# Patient Record
Sex: Female | Born: 1990 | Race: Black or African American | Hispanic: No | Marital: Single | Smoking: Never smoker
Health system: Southern US, Community
[De-identification: ages and names within clinical notes are randomized; demographics above are authoritative.]

## PROBLEM LIST (undated history)

## (undated) ENCOUNTER — Emergency Department (HOSPITAL_COMMUNITY): Payer: 59

## (undated) DIAGNOSIS — R51 Headache: Secondary | ICD-10-CM

## (undated) DIAGNOSIS — R519 Headache, unspecified: Secondary | ICD-10-CM

## (undated) HISTORY — DX: Headache, unspecified: R51.9

## (undated) HISTORY — DX: Headache: R51

---
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
## 2011-03-27 LAB — POCT URINALYSIS DIP (DEVICE)
Bilirubin Urine: NEGATIVE
Glucose, UA: NEGATIVE mg/dL
Ketones, ur: NEGATIVE mg/dL
Nitrite: NEGATIVE
Specific Gravity, Urine: 1.02 (ref 1.005–1.030)
pH: 7.5 (ref 5.0–8.0)

## 2011-03-28 LAB — URINALYSIS, ROUTINE W REFLEX MICROSCOPIC
Bilirubin Urine: NEGATIVE
Glucose, UA: NEGATIVE
Hgb urine dipstick: NEGATIVE
Protein, ur: NEGATIVE

## 2011-03-28 LAB — CBC
HCT: 34.2 — ABNORMAL LOW
Hemoglobin: 11.6 — ABNORMAL LOW
MCHC: 33.8
RBC: 3.65 — ABNORMAL LOW
RDW: 12.8

## 2011-03-28 LAB — GC/CHLAMYDIA PROBE AMP, GENITAL
Chlamydia, DNA Probe: POSITIVE — AB
GC Probe Amp, Genital: NEGATIVE

## 2011-03-28 LAB — WET PREP, GENITAL
Trich, Wet Prep: NONE SEEN
Yeast Wet Prep HPF POC: NONE SEEN

## 2012-03-29 ENCOUNTER — Emergency Department (HOSPITAL_COMMUNITY)
Admission: EM | Admit: 2012-03-29 | Discharge: 2012-03-29 | Disposition: A | Discharge status: Home / Self Care | Payer: 59 | Attending: Emergency Medicine | Admitting: Emergency Medicine

## 2012-03-29 ENCOUNTER — Encounter (HOSPITAL_COMMUNITY): Payer: Self-pay | Admitting: Emergency Medicine

## 2012-03-29 DIAGNOSIS — M791 Myalgia, unspecified site: Secondary | ICD-10-CM

## 2012-03-29 DIAGNOSIS — IMO0001 Reserved for inherently not codable concepts without codable children: Secondary | ICD-10-CM | POA: Insufficient documentation

## 2012-03-29 DIAGNOSIS — M2550 Pain in unspecified joint: Secondary | ICD-10-CM | POA: Insufficient documentation

## 2012-03-29 DIAGNOSIS — N390 Urinary tract infection, site not specified: Secondary | ICD-10-CM | POA: Insufficient documentation

## 2012-03-29 LAB — URINALYSIS, ROUTINE W REFLEX MICROSCOPIC
Bilirubin Urine: NEGATIVE
Glucose, UA: NEGATIVE mg/dL
Ketones, ur: NEGATIVE mg/dL
Nitrite: POSITIVE — AB
Protein, ur: NEGATIVE mg/dL
Specific Gravity, Urine: 1.024 (ref 1.005–1.030)
Urobilinogen, UA: 1 mg/dL (ref 0.0–1.0)
pH: 6.5 (ref 5.0–8.0)

## 2012-03-29 LAB — URINE MICROSCOPIC-ADD ON

## 2012-03-29 MED ORDER — HYDROCODONE-ACETAMINOPHEN 5-500 MG PO TABS: 1.0000 | ORAL_TABLET | Freq: Four times a day (QID) | ORAL | Status: DC | PRN

## 2012-03-29 MED ORDER — ONDANSETRON HCL 4 MG PO TABS: 4.0000 mg | ORAL_TABLET | Freq: Four times a day (QID) | ORAL | Status: DC

## 2012-03-29 MED ORDER — CIPROFLOXACIN HCL 500 MG PO TABS: 500.0000 mg | ORAL_TABLET | Freq: Two times a day (BID) | ORAL | Status: DC

## 2012-03-29 NOTE — ED Notes (Signed)
Pt presents w/ body aches, low to mid back pain, headache since Monday. Denies sorethroat or ear pain. Denies UTI sx. Sent from [facility] and Flu swabs A & B were both negative. Rx'ed w/ numerous OTC counters w/o any relief.

## 2012-03-29 NOTE — ED Notes (Signed)
Received report from [facility], [employer] fundraiser. Assumed care at this time.

## 2012-03-29 NOTE — ED Provider Notes (Signed)
History     CSN: [id number]  Arrival date & time 03/29/12  1324   First MD Initiated Contact with Patient 03/29/12 1518      Chief Complaint  Patient presents with  . Influenza    (Consider location/radiation/quality/duration/timing/severity/associated sxs/prior treatment) HPI  Pt complaints of polyarthralgias and myalgias for one week without systemic symptoms. She did have a headache on Monday but has no headache today. She denies having one area hurt worse than others. She denies induration to the joints. She denies weight loss. She has been eating normal and has not felt weak. Other than her joint pains she has no other complaints. No N/V/D  History reviewed. No pertinent past medical history.  History reviewed. No pertinent past surgical history.  No family history on file.  History  Substance Use Topics  . Smoking status: Never Smoker   . Smokeless tobacco: Never Used  . Alcohol Use: Yes     very rarely - liquor    OB History    Grav Para Term Preterm Abortions TAB SAB Ect Mult Living                  Review of Systems  Review of Systems  Gen: no weight loss, fevers, chills, night sweats  Eyes: no discharge or drainage, no occular pain or visual changes  Nose: no epistaxis or rhinorrhea  Mouth: no dental pain, no sore throat  Neck: no neck pain  Lungs:No wheezing, coughing or hemoptysis CV: no chest pain, palpitations, dependent edema or orthopnea  Abd: no abdominal pain, nausea, vomiting  GU: no dysuria or gross hematuria  MSK:  polyarthralgia and myalgias.  Neuro: no headache, no focal neurologic deficits  Skin: no abnormalities Psyche: negative.   Allergies  Review of patient's allergies indicates no known allergies.  Home Medications   Current Outpatient Rx  Name Route Sig Dispense Refill  . BC HEADACHE PO Oral Take 1 packet by mouth daily as needed. Pain.    . IBUPROFEN 200 MG PO TABS Oral Take 200 mg by mouth every 6 (six) hours as needed.  Pain.    Marland Kitchen CIPROFLOXACIN HCL 500 MG PO TABS Oral Take 1 tablet (500 mg total) by mouth 2 (two) times daily. 14 tablet 0  . HYDROCODONE-ACETAMINOPHEN 5-500 MG PO TABS Oral Take 1-2 tablets by mouth every 6 (six) hours as needed for pain. 15 tablet 0  . ONDANSETRON HCL 4 MG PO TABS Oral Take 1 tablet (4 mg total) by mouth every 6 (six) hours. 12 tablet 0    BP 99/55  Pulse 89  Temp 99 F (37.2 C) (Oral)  Resp 16  Ht 5\' 8"  (1.727 m)  Wt 140 lb (63.504 kg)  BMI 21.29 kg/m2  SpO2 91%  Physical Exam  Nursing note and vitals reviewed. Constitutional: She appears well-developed and well-nourished. No distress.  HENT:  Head: Normocephalic and atraumatic.  Eyes: Pupils are equal, round, and reactive to light.  Neck: Normal range of motion. Neck supple.  Cardiovascular: Normal rate and regular rhythm.   Pulmonary/Chest: Effort normal.  Abdominal: Soft.  Musculoskeletal: Normal range of motion.  Neurological: She is alert.  Skin: Skin is warm and dry.    ED Course  Procedures (including critical care time)  Labs Reviewed  URINALYSIS, ROUTINE W REFLEX MICROSCOPIC - Abnormal; Notable for the following:    APPearance CLOUDY (*)     Hgb urine dipstick TRACE (*)     Nitrite POSITIVE (*)  Leukocytes, UA TRACE (*)     All other components within normal limits  URINE MICROSCOPIC-ADD ON - Abnormal; Notable for the following:    Bacteria, UA MANY (*)     All other components within normal limits  PREGNANCY, URINE   No results found.   1. Myalgia   2. Polyarthralgia   3. UTI (lower urinary tract infection)       MDM  No warning symptoms of joints paints. Symptoms have been for 1 week. I have advised the patient that if the symptoms persist past a total of two weeks than she needs to be re-evaluated for further work-up into why she is having joints pains. Such as immune diseases or other bad causes of pains. She voices her understanding. In the meantime, she needs to get rest,  drink plenty of water and eat well. Pt also has UTI, no concerns for Pyelo. Cipro and Zofran written.   Pt has been advised of the symptoms that warrant their return to the ED. Patient has voiced understanding and has agreed to follow-up with the PCP or specialist.         Dorthula Matas, PA 03/29/12 1656

## 2012-04-01 NOTE — ED Provider Notes (Signed)
Medical screening examination/treatment/procedure(s) were performed by non-physician practitioner and as supervising physician I was immediately available for consultation/collaboration.  Raeford Razor, MD 04/01/12 [phone]

## 2014-02-11 ENCOUNTER — Emergency Department (HOSPITAL_COMMUNITY): Payer: 59

## 2014-02-11 ENCOUNTER — Emergency Department (HOSPITAL_COMMUNITY)
Admission: EM | Admit: 2014-02-11 | Discharge: 2014-02-11 | Disposition: A | Discharge status: Home / Self Care | Payer: 59 | Attending: Emergency Medicine | Admitting: Emergency Medicine

## 2014-02-11 ENCOUNTER — Encounter (HOSPITAL_COMMUNITY): Payer: Self-pay | Admitting: Emergency Medicine

## 2014-02-11 DIAGNOSIS — S50312A Abrasion of left elbow, initial encounter: Secondary | ICD-10-CM

## 2014-02-11 DIAGNOSIS — Z79899 Other long term (current) drug therapy: Secondary | ICD-10-CM | POA: Diagnosis not present

## 2014-02-11 DIAGNOSIS — S7010XA Contusion of unspecified thigh, initial encounter: Secondary | ICD-10-CM | POA: Insufficient documentation

## 2014-02-11 DIAGNOSIS — S59909A Unspecified injury of unspecified elbow, initial encounter: Secondary | ICD-10-CM | POA: Insufficient documentation

## 2014-02-11 DIAGNOSIS — Y9389 Activity, other specified: Secondary | ICD-10-CM | POA: Diagnosis not present

## 2014-02-11 DIAGNOSIS — Z7982 Long term (current) use of aspirin: Secondary | ICD-10-CM | POA: Insufficient documentation

## 2014-02-11 DIAGNOSIS — S59919A Unspecified injury of unspecified forearm, initial encounter: Secondary | ICD-10-CM

## 2014-02-11 DIAGNOSIS — S6990XA Unspecified injury of unspecified wrist, hand and finger(s), initial encounter: Secondary | ICD-10-CM

## 2014-02-11 DIAGNOSIS — S7012XA Contusion of left thigh, initial encounter: Secondary | ICD-10-CM

## 2014-02-11 DIAGNOSIS — Y92410 Unspecified street and highway as the place of occurrence of the external cause: Secondary | ICD-10-CM | POA: Insufficient documentation

## 2014-02-11 DIAGNOSIS — IMO0002 Reserved for concepts with insufficient information to code with codable children: Secondary | ICD-10-CM | POA: Diagnosis not present

## 2014-02-11 DIAGNOSIS — M25522 Pain in left elbow: Secondary | ICD-10-CM

## 2014-02-11 DIAGNOSIS — Z3202 Encounter for pregnancy test, result negative: Secondary | ICD-10-CM | POA: Diagnosis not present

## 2014-02-11 LAB — POC URINE PREG, ED: Preg Test, Ur: NEGATIVE

## 2014-02-11 MED ORDER — HYDROCODONE-ACETAMINOPHEN 5-325 MG PO TABS: 1.0000 | ORAL_TABLET | Freq: Four times a day (QID) | ORAL | Status: DC | PRN

## 2014-02-11 MED ORDER — OXYCODONE HCL 5 MG PO TABS
5.0000 mg | ORAL_TABLET | Freq: Once | ORAL | Status: AC
  Administered 2014-02-11: 5 mg via ORAL
  Filled 2014-02-11: qty 1

## 2014-02-11 MED ORDER — METHOCARBAMOL 500 MG PO TABS: 500.0000 mg | ORAL_TABLET | Freq: Two times a day (BID) | ORAL | Status: DC

## 2014-02-11 MED ORDER — ACETAMINOPHEN 325 MG PO TABS
650.0000 mg | ORAL_TABLET | Freq: Once | ORAL | Status: AC
  Administered 2014-02-11: 650 mg via ORAL
  Filled 2014-02-11: qty 2

## 2014-02-11 MED ORDER — NAPROXEN 500 MG PO TABS: 500.0000 mg | ORAL_TABLET | Freq: Two times a day (BID) | ORAL | Status: DC

## 2014-02-11 NOTE — ED Notes (Signed)
Pt ambulatory to restroom
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
## 2014-02-11 NOTE — ED Notes (Signed)
PA at bedside.
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
## 2014-08-20 ENCOUNTER — Ambulatory Visit (INDEPENDENT_AMBULATORY_CARE_PROVIDER_SITE_OTHER): Payer: 59 | Admitting: Licensed Clinical Social Worker

## 2014-08-20 DIAGNOSIS — F3341 Major depressive disorder, recurrent, in partial remission: Secondary | ICD-10-CM

## 2015-08-17 DIAGNOSIS — Z01419 Encounter for gynecological examination (general) (routine) without abnormal findings: Secondary | ICD-10-CM | POA: Diagnosis not present

## 2015-08-17 DIAGNOSIS — N63 Unspecified lump in breast: Secondary | ICD-10-CM | POA: Diagnosis not present

## 2015-08-17 DIAGNOSIS — Z30431 Encounter for routine checking of intrauterine contraceptive device: Secondary | ICD-10-CM | POA: Diagnosis not present

## 2015-08-22 ENCOUNTER — Other Ambulatory Visit: Payer: Self-pay | Admitting: Nurse Practitioner

## 2015-08-22 DIAGNOSIS — N6320 Unspecified lump in the left breast, unspecified quadrant: Principal | ICD-10-CM

## 2015-08-22 DIAGNOSIS — N6325 Unspecified lump in the left breast, overlapping quadrants: Secondary | ICD-10-CM

## 2015-08-25 ENCOUNTER — Ambulatory Visit
Admission: RE | Admit: 2015-08-25 | Discharge: 2015-08-25 | Disposition: A | Discharge status: Home / Self Care | Payer: 59 | Source: Ambulatory Visit | Attending: Nurse Practitioner | Admitting: Nurse Practitioner

## 2015-08-25 DIAGNOSIS — N63 Unspecified lump in breast: Secondary | ICD-10-CM | POA: Diagnosis not present

## 2015-08-25 DIAGNOSIS — N6320 Unspecified lump in the left breast, unspecified quadrant: Principal | ICD-10-CM

## 2015-08-25 DIAGNOSIS — N6325 Unspecified lump in the left breast, overlapping quadrants: Secondary | ICD-10-CM

## 2015-09-20 DIAGNOSIS — Z3200 Encounter for pregnancy test, result unknown: Secondary | ICD-10-CM | POA: Diagnosis not present

## 2015-09-20 DIAGNOSIS — Z30433 Encounter for removal and reinsertion of intrauterine contraceptive device: Secondary | ICD-10-CM | POA: Diagnosis not present

## 2015-10-19 DIAGNOSIS — Z30431 Encounter for routine checking of intrauterine contraceptive device: Secondary | ICD-10-CM | POA: Diagnosis not present

## 2015-11-30 ENCOUNTER — Encounter: Payer: Self-pay | Admitting: Internal Medicine

## 2015-11-30 ENCOUNTER — Ambulatory Visit (INDEPENDENT_AMBULATORY_CARE_PROVIDER_SITE_OTHER): Payer: 59 | Admitting: Internal Medicine

## 2015-11-30 VITALS — BP 116/78 | HR 69 | Temp 99.0°F | Ht 69.0 in | Wt 180.0 lb

## 2015-11-30 DIAGNOSIS — R519 Headache, unspecified: Secondary | ICD-10-CM

## 2015-11-30 DIAGNOSIS — Z0000 Encounter for general adult medical examination without abnormal findings: Secondary | ICD-10-CM

## 2015-11-30 DIAGNOSIS — R21 Rash and other nonspecific skin eruption: Secondary | ICD-10-CM | POA: Diagnosis not present

## 2015-11-30 DIAGNOSIS — R51 Headache: Secondary | ICD-10-CM | POA: Diagnosis not present

## 2015-11-30 NOTE — Progress Notes (Signed)
HPI  Pt presents to the clinic today to establish care. She has not had a PCP in many years. She would like her annual exam today.  Frequent headaches: This was occuring every day over the last 2 months, but has been better over the last 2 weeks. It occurs in her temples. She describes the pain as sharp, stabbing and pens and needles. She denies dizziness or blurred visoin. She denies sensitivity to light and sound. She denies nausea and vomiting. She was taking Aleve with good relief.  She also reports recurrent ringworm on her abdomen. She does not have the rash now but reports it comes and goes. She uses Lotrimin with good relief but reports she is not sure why it keeps coming back.  Flu: never Tetanus: 2010 Pap Smear: 09/2015 Dentist: biannually  Diet: She does eat meat. She eats fruits and veggies a few days per week. She does consume some fried foods. She drinks soda, water and juice Exercise: None  Past Medical History  Diagnosis Date  . Frequent headaches     Current Outpatient Prescriptions  Medication Sig Dispense Refill  . levonorgestrel (MIRENA, 52 MG,) 20 MCG/24HR IUD by Intrauterine route once. Inserted 09/2015     No current facility-administered medications for this visit.    No Known Allergies  Family History  Problem Relation Age of Onset  . Depression Mother   . Hyperlipidemia Father   . Hypertension Father   . Depression Father   . Diabetes Father   . Diabetes Paternal Aunt   . Hyperlipidemia Paternal Aunt   . Hypertension Paternal Aunt   . Diabetes Paternal Uncle   . Hyperlipidemia Paternal Uncle   . Hypertension Paternal Uncle     Social History   Social History  . Marital Status: Single    Spouse Name: N/A  . Number of Children: N/A  . Years of Education: N/A   Occupational History  . Not on file.   Social History Main Topics  . Smoking status: Never Smoker   . Smokeless tobacco: Never Used  . Alcohol Use: Yes     Comment: very rarely -  liquor  . Drug Use: Yes    Special: Marijuana     Comment: every other week  . Sexual Activity: No   Other Topics Concern  . Not on file   Social History Narrative    ROS:  Constitutional: Denies fever, malaise, fatigue, headache or abrupt weight changes.  HEENT: Denies eye pain, eye redness, ear pain, ringing in the ears, wax buildup, runny nose, nasal congestion, bloody nose, or sore throat. Respiratory: Denies difficulty breathing, shortness of breath, cough or sputum production.   Cardiovascular: Denies chest pain, chest tightness, palpitations or swelling in the hands or feet.  Gastrointestinal: Denies abdominal pain, bloating, constipation, diarrhea or blood in the stool.  GU: Denies frequency, urgency, pain with urination, blood in urine, odor or discharge. Musculoskeletal: Denies decrease in range of motion, difficulty with gait, muscle pain or joint pain and swelling.  Skin: Denies redness, rashes, lesions or ulcercations.  Neurological: Denies dizziness, difficulty with memory, difficulty with speech or problems with balance and coordination.  Psych: Denies anxiety, depression, SI/HI.  No other specific complaints in a complete review of systems (except as listed in HPI above).  PE:  BP 116/78 mmHg  Pulse 69  Temp(Src) 99 F (37.2 C) (Oral)  Ht 5\' 9"  (1.753 m)  Wt 180 lb (81.647 kg)  BMI 26.57 kg/m2  SpO2 99% Wt  Readings from Last 3 Encounters:  11/30/15 180 lb (81.647 kg)  03/29/12 140 lb (63.504 kg)    General: Appears her stated age, well developed, well nourished in NAD. HEENT: Head: normal shape and size; Eyes: sclera white, no icterus, conjunctiva pink, PERRLA and EOMs intact; Ears: Tm's gray and intact, normal light reflex; Throat/Mouth: Teeth present, mucosa pink and moist, no lesions or ulcerations noted.  Neck: Neck supple, trachea midline. No masses, lumps or thyromegaly present.  Cardiovascular: Normal rate and rhythm. S1,S2 noted.  No murmur, rubs  or gallops noted.  Pulmonary/Chest: Normal effort and positive vesicular breath sounds. No respiratory distress. No wheezes, rales or ronchi noted.  Abdomen: Soft and nontender. Normal bowel sounds. No distention or masses noted. Liver, spleen and kidneys non palpable. Musculoskeletal: Strength 5/5 BUE/BLE. No signs of joint swelling. No difficulty with gait.  Neurological: Alert and oriented. Cranial nerves II-XII grossly intact. Coordination normal.  Psychiatric: Mood and affect normal. Behavior is normal. Judgment and thought content normal.    Assessment and Plan:  Preventative Health Maintenance:  Encouraged her to get a flu shot in the fall Tetanus UTD Pap Smear UTD Encouraged her to consume a balanced diet and start an exercise regimen Encouraged her to continue to see a dentist at least annually She had HIV screening 09/2015 Will check CBC, CMET, Lipid and A1C today  Frequent Headaches:  Improved Will continue to monitor   Rash:  Advised her next time it occurs, make appt to be seen to make sure it is ringworm  RTC in 1 year, sooner if needed

## 2015-11-30 NOTE — Patient Instructions (Signed)
Health Maintenance, Female Adopting a healthy lifestyle and getting preventive care can go a long way to promote health and wellness. Talk with your health care provider about what schedule of regular examinations is right for you. This is a good chance for you to check in with your provider about disease prevention and staying healthy. In between checkups, there are plenty of things you can do on your own. Experts have done a lot of research about which lifestyle changes and preventive measures are most likely to keep you healthy. Ask your health care provider for more information. WEIGHT AND DIET  Eat a healthy diet  Be sure to include plenty of vegetables, fruits, low-fat dairy products, and lean protein.  Do not eat a lot of foods high in solid fats, added sugars, or salt.  Get regular exercise. This is one of the most important things you can do for your health.  Most adults should exercise for at least 150 minutes each week. The exercise should increase your heart rate and make you sweat (moderate-intensity exercise).  Most adults should also do strengthening exercises at least twice a week. This is in addition to the moderate-intensity exercise.  Maintain a healthy weight  Body mass index (BMI) is a measurement that can be used to identify possible weight problems. It estimates body fat based on height and weight. Your health care provider can help determine your BMI and help you achieve or maintain a healthy weight.  For females 20 years of age and older:   A BMI below 18.5 is considered underweight.  A BMI of 18.5 to 24.9 is normal.  A BMI of 25 to 29.9 is considered overweight.  A BMI of 30 and above is considered obese.  Watch levels of cholesterol and blood lipids  You should start having your blood tested for lipids and cholesterol at 24 years of age, then have this test every 5 years.  You may need to have your cholesterol levels checked more often if:  Your lipid  or cholesterol levels are high.  You are older than 24 years of age.  You are at high risk for heart disease.  CANCER SCREENING   Lung Cancer  Lung cancer screening is recommended for adults 55-80 years old who are at high risk for lung cancer because of a history of smoking.  A yearly low-dose CT scan of the lungs is recommended for people who:  Currently smoke.  Have quit within the past 15 years.  Have at least a 30-pack-year history of smoking. A pack year is smoking an average of one pack of cigarettes a day for 1 year.  Yearly screening should continue until it has been 15 years since you quit.  Yearly screening should stop if you develop a health problem that would prevent you from having lung cancer treatment.  Breast Cancer  Practice breast self-awareness. This means understanding how your breasts normally appear and feel.  It also means doing regular breast self-exams. Let your health care provider know about any changes, no matter how small.  If you are in your 20s or 30s, you should have a clinical breast exam (CBE) by a health care provider every 1-3 years as part of a regular health exam.  If you are 40 or older, have a CBE every year. Also consider having a breast X-ray (mammogram) every year.  If you have a family history of breast cancer, talk to your health care provider about genetic screening.  If you   are at high risk for breast cancer, talk to your health care provider about having an MRI and a mammogram every year.  Breast cancer gene (BRCA) assessment is recommended for women who have family members with BRCA-related cancers. BRCA-related cancers include:  Breast.  Ovarian.  Tubal.  Peritoneal cancers.  Results of the assessment will determine the need for genetic counseling and BRCA1 and BRCA2 testing. Cervical Cancer Your health care provider may recommend that you be screened regularly for cancer of the pelvic organs (ovaries, uterus, and  vagina). This screening involves a pelvic examination, including checking for microscopic changes to the surface of your cervix (Pap test). You may be encouraged to have this screening done every 3 years, beginning at age 21.  For women ages 30-65, health care providers may recommend pelvic exams and Pap testing every 3 years, or they may recommend the Pap and pelvic exam, combined with testing for human papilloma virus (HPV), every 5 years. Some types of HPV increase your risk of cervical cancer. Testing for HPV may also be done on women of any age with unclear Pap test results.  Other health care providers may not recommend any screening for nonpregnant women who are considered low risk for pelvic cancer and who do not have symptoms. Ask your health care provider if a screening pelvic exam is right for you.  If you have had past treatment for cervical cancer or a condition that could lead to cancer, you need Pap tests and screening for cancer for at least 20 years after your treatment. If Pap tests have been discontinued, your risk factors (such as having a new sexual partner) need to be reassessed to determine if screening should resume. Some women have medical problems that increase the chance of getting cervical cancer. In these cases, your health care provider may recommend more frequent screening and Pap tests. Colorectal Cancer  This type of cancer can be detected and often prevented.  Routine colorectal cancer screening usually begins at 24 years of age and continues through 24 years of age.  Your health care provider may recommend screening at an earlier age if you have risk factors for colon cancer.  Your health care provider may also recommend using home test kits to check for hidden blood in the stool.  A small camera at the end of a tube can be used to examine your colon directly (sigmoidoscopy or colonoscopy). This is done to check for the earliest forms of colorectal  cancer.  Routine screening usually begins at age 50.  Direct examination of the colon should be repeated every 5-10 years through 24 years of age. However, you may need to be screened more often if early forms of precancerous polyps or small growths are found. Skin Cancer  Check your skin from head to toe regularly.  Tell your health care provider about any new moles or changes in moles, especially if there is a change in a mole's shape or color.  Also tell your health care provider if you have a mole that is larger than the size of a pencil eraser.  Always use sunscreen. Apply sunscreen liberally and repeatedly throughout the day.  Protect yourself by wearing long sleeves, pants, a wide-brimmed hat, and sunglasses whenever you are outside. HEART DISEASE, DIABETES, AND HIGH BLOOD PRESSURE   High blood pressure causes heart disease and increases the risk of stroke. High blood pressure is more likely to develop in:  People who have blood pressure in the high end   of the normal range (130-139/85-89 mm Hg).  People who are overweight or obese.  People who are African American.  If you are 38-23 years of age, have your blood pressure checked every 3-5 years. If you are 61 years of age or older, have your blood pressure checked every year. You should have your blood pressure measured twice--once when you are at a hospital or clinic, and once when you are not at a hospital or clinic. Record the average of the two measurements. To check your blood pressure when you are not at a hospital or clinic, you can use:  An automated blood pressure machine at a pharmacy.  A home blood pressure monitor.  If you are between 45 years and 39 years old, ask your health care provider if you should take aspirin to prevent strokes.  Have regular diabetes screenings. This involves taking a blood sample to check your fasting blood sugar level.  If you are at a normal weight and have a low risk for diabetes,  have this test once every three years after 24 years of age.  If you are overweight and have a high risk for diabetes, consider being tested at a younger age or more often. PREVENTING INFECTION  Hepatitis B  If you have a higher risk for hepatitis B, you should be screened for this virus. You are considered at high risk for hepatitis B if:  You were born in a country where hepatitis B is common. Ask your health care provider which countries are considered high risk.  Your parents were born in a high-risk country, and you have not been immunized against hepatitis B (hepatitis B vaccine).  You have HIV or AIDS.  You use needles to inject street drugs.  You live with someone who has hepatitis B.  You have had sex with someone who has hepatitis B.  You get hemodialysis treatment.  You take certain medicines for conditions, including cancer, organ transplantation, and autoimmune conditions. Hepatitis C  Blood testing is recommended for:  Everyone born from [birth year] through 1964.  Anyone with known risk factors for hepatitis C. Sexually transmitted infections (STIs)  You should be screened for sexually transmitted infections (STIs) including gonorrhea and chlamydia if:  You are sexually active and are younger than 24 years of age.  You are older than 24 years of age and your health care provider tells you that you are at risk for this type of infection.  Your sexual activity has changed since you were last screened and you are at an increased risk for chlamydia or gonorrhea. Ask your health care provider if you are at risk.  If you do not have HIV, but are at risk, it may be recommended that you take a prescription medicine daily to prevent HIV infection. This is called pre-exposure prophylaxis (PrEP). You are considered at risk if:  You are sexually active and do not regularly use condoms or know the HIV status of your partner(s).  You take drugs by injection.  You are sexually  active with a partner who has HIV. Talk with your health care provider about whether you are at high risk of being infected with HIV. If you choose to begin PrEP, you should first be tested for HIV. You should then be tested every 3 months for as long as you are taking PrEP.  PREGNANCY   If you are premenopausal and you may become pregnant, ask your health care provider about preconception counseling.  If you may  become pregnant, take 400 to 800 micrograms (mcg) of folic acid every day.  If you want to prevent pregnancy, talk to your health care provider about birth control (contraception). OSTEOPOROSIS AND MENOPAUSE   Osteoporosis is a disease in which the bones lose minerals and strength with aging. This can result in serious bone fractures. Your risk for osteoporosis can be identified using a bone density scan.  If you are 61 years of age or older, or if you are at risk for osteoporosis and fractures, ask your health care provider if you should be screened.  Ask your health care provider whether you should take a calcium or vitamin D supplement to lower your risk for osteoporosis.  Menopause may have certain physical symptoms and risks.  Hormone replacement therapy may reduce some of these symptoms and risks. Talk to your health care provider about whether hormone replacement therapy is right for you.  HOME CARE INSTRUCTIONS   Schedule regular health, dental, and eye exams.  Stay current with your immunizations.   Do not use any tobacco products including cigarettes, chewing tobacco, or electronic cigarettes.  If you are pregnant, do not drink alcohol.  If you are breastfeeding, limit how much and how often you drink alcohol.  Limit alcohol intake to no more than 1 drink per day for nonpregnant women. One drink equals 12 ounces of beer, 5 ounces of wine, or 1 ounces of hard liquor.  Do not use street drugs.  Do not share needles.  Ask your health care provider for help if  you need support or information about quitting drugs.  Tell your health care provider if you often feel depressed.  Tell your health care provider if you have ever been abused or do not feel safe at home.   This information is not intended to replace advice given to you by your health care provider. Make sure you discuss any questions you have with your health care provider.   Document Released: 12/25/2010 Document Revised: 07/02/2014 Document Reviewed: 05/13/2013 Elsevier Interactive Patient Education [employer].

## 2015-11-30 NOTE — Progress Notes (Signed)
Pre visit review using our clinic review tool, if applicable. No additional management support is needed unless otherwise documented below in the visit note. 

## 2015-12-01 LAB — LIPID PANEL
CHOL/HDL RATIO: 3
Cholesterol: 160 mg/dL (ref 0–200)
HDL: 50.9 mg/dL (ref >39.00)
LDL CALC: 102 mg/dL — AB (ref 0–99)
NonHDL: 109.53
Triglycerides: 37 mg/dL (ref 0.0–149.0)
VLDL: 7.4 mg/dL (ref 0.0–40.0)

## 2015-12-01 LAB — COMPREHENSIVE METABOLIC PANEL
ALT: 10 U/L (ref 0–35)
AST: 15 U/L (ref 0–37)
Albumin: 4.2 g/dL (ref 3.5–5.2)
Alkaline Phosphatase: 48 U/L (ref 39–117)
BILIRUBIN TOTAL: 1 mg/dL (ref 0.2–1.2)
BUN: 10 mg/dL (ref 6–23)
CHLORIDE: 102 meq/L (ref 96–112)
CO2: 30 meq/L (ref 19–32)
Calcium: 9.9 mg/dL (ref 8.4–10.5)
Creatinine, Ser: 0.62 mg/dL (ref 0.40–1.20)
GFR: 151.55 mL/min (ref >60.00)
GLUCOSE: 69 mg/dL — AB (ref 70–99)
Potassium: 4.2 mEq/L (ref 3.5–5.1)
Sodium: 138 mEq/L (ref 135–145)
Total Protein: 7.9 g/dL (ref 6.0–8.3)

## 2015-12-01 LAB — CBC
HCT: 35.1 % — ABNORMAL LOW (ref 36.0–46.0)
Hemoglobin: 11.8 g/dL — ABNORMAL LOW (ref 12.0–15.0)
MCHC: 33.5 g/dL (ref 30.0–36.0)
MCV: 93.1 fl (ref 78.0–100.0)
Platelets: 394 10*3/uL (ref 150.0–400.0)
RBC: 3.77 Mil/uL — ABNORMAL LOW (ref 3.87–5.11)
RDW: 13.6 % (ref 11.5–15.5)
WBC: 8.4 10*3/uL (ref 4.0–10.5)

## 2015-12-01 LAB — HEMOGLOBIN A1C: Hgb A1c MFr Bld: 5.9 % (ref 4.6–6.5)

## 2016-02-13 DIAGNOSIS — H52223 Regular astigmatism, bilateral: Secondary | ICD-10-CM | POA: Diagnosis not present

## 2016-02-13 DIAGNOSIS — H5213 Myopia, bilateral: Secondary | ICD-10-CM | POA: Diagnosis not present

## 2016-04-11 ENCOUNTER — Ambulatory Visit (INDEPENDENT_AMBULATORY_CARE_PROVIDER_SITE_OTHER): Payer: 59 | Admitting: Internal Medicine

## 2016-04-11 ENCOUNTER — Encounter: Payer: Self-pay | Admitting: Internal Medicine

## 2016-04-11 VITALS — BP 108/74 | HR 69 | Temp 98.2°F | Wt 180.0 lb

## 2016-04-11 DIAGNOSIS — F411 Generalized anxiety disorder: Secondary | ICD-10-CM | POA: Diagnosis not present

## 2016-04-11 MED ORDER — ESCITALOPRAM OXALATE 10 MG PO TABS: 10.0000 mg | ORAL_TABLET | Freq: Every day | ORAL | 1 refills | Status: DC

## 2016-04-11 NOTE — Patient Instructions (Signed)
Generalized Anxiety Disorder Generalized anxiety disorder (GAD) is a mental disorder. It interferes with life functions, including relationships, work, and school. GAD is different from normal anxiety, which everyone experiences at some point in their lives in response to specific life events and activities. Normal anxiety actually helps us prepare for and get through these life events and activities. Normal anxiety goes away after the event or activity is over.  GAD causes anxiety that is not necessarily related to specific events or activities. It also causes excess anxiety in proportion to specific events or activities. The anxiety associated with GAD is also difficult to control. GAD can vary from mild to severe. People with severe GAD can have intense waves of anxiety with physical symptoms (panic attacks).  SYMPTOMS The anxiety and worry associated with GAD are difficult to control. This anxiety and worry are related to many life events and activities and also occur more days than not for 6 months or longer. People with GAD also have three or more of the following symptoms (one or more in children):  Restlessness.   Fatigue.  Difficulty concentrating.   Irritability.  Muscle tension.  Difficulty sleeping or unsatisfying sleep. DIAGNOSIS GAD is diagnosed through an assessment by your health care provider. Your health care provider will ask you questions aboutyour mood,physical symptoms, and events in your life. Your health care provider may ask you about your medical history and use of alcohol or drugs, including prescription medicines. Your health care provider may also do a physical exam and blood tests. Certain medical conditions and the use of certain substances can cause symptoms similar to those associated with GAD. Your health care provider may refer you to a mental health specialist for further evaluation. TREATMENT The following therapies are usually used to treat GAD:    Medication. Antidepressant medication usually is prescribed for long-term daily control. Antianxiety medicines may be added in severe cases, especially when panic attacks occur.   Talk therapy (psychotherapy). Certain types of talk therapy can be helpful in treating GAD by providing support, education, and guidance. A form of talk therapy called cognitive behavioral therapy can teach you healthy ways to think about and react to daily life events and activities.  Stress managementtechniques. These include yoga, meditation, and exercise and can be very helpful when they are practiced regularly. A mental health specialist can help determine which treatment is best for you. Some people see improvement with one therapy. However, other people require a combination of therapies.   This information is not intended to replace advice given to you by your health care provider. Make sure you discuss any questions you have with your health care provider.   Document Released: 10/06/2012 Document Revised: 07/02/2014 Document Reviewed: 10/06/2012 Elsevier Interactive Patient Education 2016 [employer]  

## 2016-04-11 NOTE — Progress Notes (Signed)
Subjective:    Patient ID: Betty Rhodes, female    DOB: 07/09/1990, 25 y.o.   MRN: [id number]  HPI  Pt presents to the clinic today with c/o feeling anxious and overwhelmed. She starting noticing this 1 month ago. She feels like she has too much on her place between her work life and home life. She did have 2-3 "panic attacks" over the last month in which she describes having "butterfilies in my chest", feeling like her heart is racing. She denies chest pain, chest tightness, shortness of breath or dizziness. She reports she has felt depressed (tried and failed Celexa, Prozac in the past) and had anger issues/outbursts in the past and this feels completely different. She is not sure what is triggering these feelings of anxiety, but reports she can not handle it on her own anymore. She denies SI/HI.  Review of Systems      Past Medical History:  Diagnosis Date  . Frequent headaches     Current Outpatient Prescriptions  Medication Sig Dispense Refill  . levonorgestrel (MIRENA, 52 MG,) 20 MCG/24HR IUD by Intrauterine route once. Inserted 09/2015     No current facility-administered medications for this visit.     No Known Allergies  Family History  Problem Relation Age of Onset  . Depression Mother   . Hyperlipidemia Father   . Hypertension Father   . Depression Father   . Diabetes Father   . Diabetes Paternal Aunt   . Hyperlipidemia Paternal Aunt   . Hypertension Paternal Aunt   . Diabetes Paternal Uncle   . Hyperlipidemia Paternal Uncle   . Hypertension Paternal Uncle     Social History   Social History  . Marital status: Single    Spouse name: N/A  . Number of children: N/A  . Years of education: N/A   Occupational History  . Not on file.   Social History Main Topics  . Smoking status: Never Smoker  . Smokeless tobacco: Never Used  . Alcohol use Yes     Comment: social - liquor  . Drug use:     Types: Marijuana     Comment: every other week  . Sexual  activity: Yes    Birth control/ protection: IUD   Other Topics Concern  . Not on file   Social History Narrative  . No narrative on file     Constitutional: Denies fever, malaise, fatigue, headache or abrupt weight changes.  Respiratory: Denies difficulty breathing, shortness of breath, cough or sputum production.   Cardiovascular: Denies chest pain, chest tightness, palpitations or swelling in the hands or feet.  Neurological: Denies dizziness, difficulty with memory, difficulty with speech or problems with balance and coordination.  Psych: Pt reports anxiety. Denies depression, SI/HI.  No other specific complaints in a complete review of systems (except as listed in HPI above).  Objective:   Physical Exam  BP 108/74   Pulse 69   Temp 98.2 F (36.8 C) (Oral)   Wt 180 lb (81.6 kg)   SpO2 99%   BMI 26.58 kg/m  Wt Readings from Last 3 Encounters:  04/11/16 180 lb (81.6 kg)  11/30/15 180 lb (81.6 kg)  03/29/12 140 lb (63.5 kg)    General: Appears her stated age, well developed, well nourished in NAD. Cardiovascular: Normal rate and rhythm. S1,S2 noted.  No murmur, rubs or gallops noted.  Pulmonary/Chest: Normal effort and positive vesicular breath sounds. No respiratory distress. No wheezes, rales or ronchi noted.  Neurological: Alert  and oriented.  Psychiatric: She is mildly anxious appearing. Judgment and thought content normal.    BMET    Component Value Date/Time   NA 138 11/30/2015 1539   K 4.2 11/30/2015 1539   CL 102 11/30/2015 1539   CO2 30 11/30/2015 1539   GLUCOSE 69 (L) 11/30/2015 1539   BUN 10 11/30/2015 1539   CREATININE 0.62 11/30/2015 1539   CALCIUM 9.9 11/30/2015 1539    Lipid Panel     Component Value Date/Time   CHOL 160 11/30/2015 1539   TRIG 37.0 11/30/2015 1539   HDL 50.90 11/30/2015 1539   CHOLHDL 3 11/30/2015 1539   VLDL 7.4 11/30/2015 1539   LDLCALC 102 (H) 11/30/2015 1539    CBC    Component Value Date/Time   WBC 8.4  11/30/2015 1539   RBC 3.77 (L) 11/30/2015 1539   HGB 11.8 (L) 11/30/2015 1539   HCT 35.1 (L) 11/30/2015 1539   PLT 394.0 11/30/2015 1539   MCV 93.1 11/30/2015 1539   MCH 31.7 05/17/2010 0046   MCHC 33.5 11/30/2015 1539   RDW 13.6 11/30/2015 1539    Hgb A1C Lab Results  Component Value Date   HGBA1C 5.9 11/30/2015        Assessment & Plan:   Anxiety state:  Support offered today Discussed treatment with therapy vs medication vs a combination of both She plans to start seeing a therapist and does not need a referral eRx for Lexapro 10 mg QHS- discussed side effects, she agrees to proceed  RTC in 6 weeks to follow up anxiety  Nicki Reaper, NP

## 2016-05-23 MED FILL — ESCITALOPRAM 10 MG TABLET: 10 | 30 days supply | Qty: 30 | Fill #0

## 2016-08-15 ENCOUNTER — Telehealth: Payer: Self-pay | Admitting: Internal Medicine

## 2016-08-15 NOTE — Telephone Encounter (Signed)
She needs to make a follow up appt to discuss all these issues.

## 2016-08-15 NOTE — Telephone Encounter (Signed)
Patient was seen a couple months ago for a skin condition.  Patient was told to call back, if she wanted to be referred to a dermatologist.  Patient would like to be referred to a dermatologist.  Patient [facility] [place].  Patient prefers a morning appointment.  Patient wants to change from Lexapro to another medication.  Patient feels it's not working for what she's dealing with.  It makes her sleep all day. Does you need to make an appointment or can the medication be changed? Patient [facility] [place].

## 2016-08-16 NOTE — Telephone Encounter (Signed)
Left detailed msg on VM per HIPAA Pt needs to schedule 30 min OV to f/u

## 2016-08-27 ENCOUNTER — Ambulatory Visit (INDEPENDENT_AMBULATORY_CARE_PROVIDER_SITE_OTHER): Payer: 59 | Admitting: Internal Medicine

## 2016-08-27 ENCOUNTER — Encounter: Payer: Self-pay | Admitting: Internal Medicine

## 2016-08-27 VITALS — BP 118/78 | HR 85 | Temp 98.4°F | Wt 180.0 lb

## 2016-08-27 DIAGNOSIS — F411 Generalized anxiety disorder: Secondary | ICD-10-CM | POA: Diagnosis not present

## 2016-08-27 DIAGNOSIS — R21 Rash and other nonspecific skin eruption: Secondary | ICD-10-CM

## 2016-08-27 MED ORDER — HYDROXYZINE HCL 10 MG PO TABS: 10.0000 mg | ORAL_TABLET | Freq: Every day | ORAL | 0 refills | Status: AC | PRN

## 2016-08-27 NOTE — Progress Notes (Signed)
Subjective:    Patient ID: Betty Rhodes, female    DOB: 1991/05/13, 26 y.o.   MRN: [id number]  HPI  Pt presents to the clinic today for follow up anxiety. She was started on Lexapro, 12/2015. She initially felt like it was controlling her anxiety. She does not feel like the medication is effective and it makes her sleepy. She reports she does not have anxiety all the time and would like to stop the Lexapro and try something just as needed.  She is also wanting a referral to dermatology. She reports she gets recurrent ringworm on her abdomen. She treats it with Lotrimin OTC but she reports it keeps coming back. She has never been evaluated in this clinic with the rash. She does not have the rash today.  Review of Systems      Past Medical History:  Diagnosis Date  . Frequent headaches     Current Outpatient Prescriptions  Medication Sig Dispense Refill  . escitalopram (LEXAPRO) 10 MG tablet Take 1 tablet (10 mg total) by mouth at bedtime. 30 tablet 1  . levonorgestrel (MIRENA, 52 MG,) 20 MCG/24HR IUD by Intrauterine route once. Inserted 09/2015     No current facility-administered medications for this visit.     No Known Allergies  Family History  Problem Relation Age of Onset  . Depression Mother   . Hyperlipidemia Father   . Hypertension Father   . Depression Father   . Diabetes Father   . Diabetes Paternal Aunt   . Hyperlipidemia Paternal Aunt   . Hypertension Paternal Aunt   . Diabetes Paternal Uncle   . Hyperlipidemia Paternal Uncle   . Hypertension Paternal Uncle     Social History   Social History  . Marital status: Single    Spouse name: N/A  . Number of children: N/A  . Years of education: N/A   Occupational History  . Not on file.   Social History Main Topics  . Smoking status: Never Smoker  . Smokeless tobacco: Never Used  . Alcohol use Yes     Comment: social - liquor  . Drug use: Yes    Types: Marijuana     Comment: every other week  . Sexual  activity: Yes    Birth control/ protection: IUD   Other Topics Concern  . Not on file   Social History Narrative  . No narrative on file     Constitutional: Denies fever, malaise, fatigue, headache or abrupt weight changes.  Skin: Pt reports intermittent rash of abdomen. Denies redness, rashes, lesions or ulcercations.  Psych: Pt reports anxiety. Denies depression, SI/HI.  No other specific complaints in a complete review of systems (except as listed in HPI above).  Objective:   Physical Exam   BP 118/78   Pulse 85   Temp 98.4 F (36.9 C) (Oral)   Wt 180 lb (81.6 kg)   SpO2 98%   BMI 26.58 kg/m  Wt Readings from Last 3 Encounters:  08/27/16 180 lb (81.6 kg)  04/11/16 180 lb (81.6 kg)  11/30/15 180 lb (81.6 kg)    General: Appears her stated age, in NAD. Skin: Hyperpigmentation noted in scattered areas over abdomen.  Neurological: Alert and oriented.  Psychiatric: Mood and affect normal. Behavior is normal. Judgment and thought content normal.    BMET    Component Value Date/Time   NA 138 11/30/2015 1539   K 4.2 11/30/2015 1539   CL 102 11/30/2015 1539   CO2 30 11/30/2015  1539   GLUCOSE 69 (L) 11/30/2015 1539   BUN 10 11/30/2015 1539   CREATININE 0.62 11/30/2015 1539   CALCIUM 9.9 11/30/2015 1539    Lipid Panel     Component Value Date/Time   CHOL 160 11/30/2015 1539   TRIG 37.0 11/30/2015 1539   HDL 50.90 11/30/2015 1539   CHOLHDL 3 11/30/2015 1539   VLDL 7.4 11/30/2015 1539   LDLCALC 102 (H) 11/30/2015 1539    CBC    Component Value Date/Time   WBC 8.4 11/30/2015 1539   RBC 3.77 (L) 11/30/2015 1539   HGB 11.8 (L) 11/30/2015 1539   HCT 35.1 (L) 11/30/2015 1539   PLT 394.0 11/30/2015 1539   MCV 93.1 11/30/2015 1539   MCH 31.7 05/17/2010 0046   MCHC 33.5 11/30/2015 1539   RDW 13.6 11/30/2015 1539    Hgb A1C Lab Results  Component Value Date   HGBA1C 5.9 11/30/2015           Assessment & Plan:   Skin rash:  She thinks its  fungal, but its hard to tell because it is not active at this time Referral to dermatology placed  Make an appt for your annual exam Nicki ReaperBAITY, Jamilyn Pigeon, NP

## 2016-08-27 NOTE — Patient Instructions (Signed)
Rash A rash is a change in the color of the skin. A rash can also change the way your skin feels. There are many different conditions and factors that can cause a rash. Follow these instructions at home: Pay attention to any changes in your symptoms. Follow these instructions to help with your condition: Medicine  Take or apply over-the-counter and prescription medicines only as told by your doctor. These may include:  Corticosteroid cream.  Anti-itch lotions.  Oral antihistamines. Skin Care   Put cool compresses on the affected areas.  Try taking a bath with:  Epsom salts. Follow the instructions on the packaging. You can get these at your local pharmacy or grocery store.  Baking soda. Pour a small amount into the bath as told by your doctor.  Colloidal oatmeal. Follow the instructions on the packaging. You can get this at your local pharmacy or grocery store.  Try putting baking soda paste onto your skin. Stir water into baking soda until it gets like a paste.  Do not scratch or rub your skin.  Avoid covering the rash. Make sure the rash is exposed to air as much as possible. General instructions   Avoid hot showers or baths, which can make itching worse. A cold shower may help.  Avoid scented soaps, detergents, and perfumes. Use gentle soaps, detergents, perfumes, and other cosmetic products.  Avoid anything that causes your rash. Keep a journal to help track what causes your rash. Write down:  What you eat.  What cosmetic products you use.  What you drink.  What you wear. This includes jewelry.  Keep all follow-up visits as told by your doctor. This is important. Contact a doctor if:  You sweat at night.  You lose weight.  You pee (urinate) more than normal.  You feel weak.  You throw up (vomit).  Your skin or the whites of your eyes look yellow (jaundice).  Your skin:  Tingles.  Is numb.  Your rash:  Does not go away after a few days.  Gets  worse.  You are:  More thirsty than normal.  More tired than normal.  You have:  New symptoms.  Pain in your belly (abdomen).  A fever.  Watery poop (diarrhea). Get help right away if:  Your rash covers all or most of your body. The rash may or may not be painful.  You have blisters that:  Are on top of the rash.  Grow larger.  Grow together.  Are painful.  Are inside your nose or mouth.  You have a rash that:  Looks like purple pinprick-sized spots all over your body.  Has a "bull's eye" or looks like a target.  Is red and painful, causes your skin to peel, and is not from being in the sun too long. This information is not intended to replace advice given to you by your health care provider. Make sure you discuss any questions you have with your health care provider. Document Released: 11/28/2007 Document Revised: 11/17/2015 Document Reviewed: 10/27/2014 Elsevier Interactive Patient Education  2017 Elsevier Inc.  

## 2016-08-27 NOTE — Assessment & Plan Note (Signed)
Stop Lexapro eRx for Hydroxyzine daily prn #20

## 2016-09-10 DIAGNOSIS — B354 Tinea corporis: Secondary | ICD-10-CM | POA: Diagnosis not present

## 2016-12-11 ENCOUNTER — Telehealth: Payer: Self-pay | Admitting: Internal Medicine

## 2016-12-11 NOTE — Telephone Encounter (Signed)
I called pt--- last CPE was 6/7/7--- pt needs to schedule CPE for form to be completed

## 2016-12-11 NOTE — Telephone Encounter (Signed)
Mother dropped off form that needs to be filled out.   Please call pt when ready for pickup  Placing in rx tower

## 2016-12-27 NOTE — Telephone Encounter (Signed)
Lm on pts vm requesting a call back to schedule CPE 

## 2017-03-28 DIAGNOSIS — H52223 Regular astigmatism, bilateral: Secondary | ICD-10-CM | POA: Diagnosis not present

## 2017-03-28 DIAGNOSIS — H5213 Myopia, bilateral: Secondary | ICD-10-CM | POA: Diagnosis not present

## 2017-04-10 DIAGNOSIS — Z01419 Encounter for gynecological examination (general) (routine) without abnormal findings: Secondary | ICD-10-CM | POA: Diagnosis not present

## 2017-04-10 DIAGNOSIS — Z6829 Body mass index (BMI) 29.0-29.9, adult: Secondary | ICD-10-CM | POA: Diagnosis not present

## 2019-06-15 ENCOUNTER — Other Ambulatory Visit: Payer: Self-pay | Admitting: Cardiology

## 2019-06-15 DIAGNOSIS — Z20822 Contact with and (suspected) exposure to covid-19: Secondary | ICD-10-CM

## 2019-06-16 LAB — NOVEL CORONAVIRUS, NAA: SARS-CoV-2, NAA: NOT DETECTED

## 2019-07-15 ENCOUNTER — Other Ambulatory Visit: Payer: 59

## 2019-07-20 ENCOUNTER — Other Ambulatory Visit: Payer: Self-pay | Admitting: Cardiology

## 2019-07-20 DIAGNOSIS — Z20822 Contact with and (suspected) exposure to covid-19: Secondary | ICD-10-CM

## 2019-07-21 LAB — NOVEL CORONAVIRUS, NAA: SARS-CoV-2, NAA: DETECTED — AB

## 2019-08-21 ENCOUNTER — Other Ambulatory Visit: Payer: Self-pay | Admitting: Obstetrics and Gynecology

## 2019-08-21 DIAGNOSIS — N63 Unspecified lump in unspecified breast: Secondary | ICD-10-CM

## 2019-09-03 ENCOUNTER — Other Ambulatory Visit: Payer: Medicaid Other

## 2019-09-15 ENCOUNTER — Ambulatory Visit
Admission: RE | Admit: 2019-09-15 | Discharge: 2019-09-15 | Disposition: A | Discharge status: Home / Self Care | Payer: Medicaid Other | Source: Ambulatory Visit | Attending: Obstetrics and Gynecology | Admitting: Obstetrics and Gynecology

## 2019-09-15 ENCOUNTER — Other Ambulatory Visit: Payer: Self-pay | Admitting: Obstetrics and Gynecology

## 2019-09-15 ENCOUNTER — Other Ambulatory Visit: Payer: Self-pay

## 2019-09-15 DIAGNOSIS — N63 Unspecified lump in unspecified breast: Secondary | ICD-10-CM

## 2019-09-15 DIAGNOSIS — N6320 Unspecified lump in the left breast, unspecified quadrant: Secondary | ICD-10-CM

## 2019-09-15 DIAGNOSIS — N6310 Unspecified lump in the right breast, unspecified quadrant: Secondary | ICD-10-CM

## 2019-09-23 ENCOUNTER — Other Ambulatory Visit: Payer: Medicaid Other

## 2019-10-07 ENCOUNTER — Ambulatory Visit
Admission: RE | Admit: 2019-10-07 | Discharge: 2019-10-07 | Disposition: A | Discharge status: Home / Self Care | Payer: Medicaid Other | Source: Ambulatory Visit | Attending: Obstetrics and Gynecology | Admitting: Obstetrics and Gynecology

## 2019-10-07 ENCOUNTER — Other Ambulatory Visit: Payer: Self-pay

## 2019-10-07 ENCOUNTER — Other Ambulatory Visit: Payer: Self-pay | Admitting: Obstetrics and Gynecology

## 2019-10-07 DIAGNOSIS — N6320 Unspecified lump in the left breast, unspecified quadrant: Secondary | ICD-10-CM

## 2020-03-18 ENCOUNTER — Other Ambulatory Visit: Payer: Medicaid Other

## 2020-04-13 ENCOUNTER — Other Ambulatory Visit: Payer: Self-pay

## 2020-04-13 ENCOUNTER — Ambulatory Visit: Payer: BC Managed Care – PPO | Admitting: Podiatry

## 2020-04-13 DIAGNOSIS — L853 Xerosis cutis: Secondary | ICD-10-CM | POA: Diagnosis not present

## 2020-04-13 DIAGNOSIS — R234 Changes in skin texture: Secondary | ICD-10-CM

## 2020-04-13 MED ORDER — AMMONIUM LACTATE 12 % EX LOTN: 1.0000 "application " | TOPICAL_LOTION | CUTANEOUS | 0 refills | Status: AC | PRN

## 2020-04-14 ENCOUNTER — Encounter: Payer: Self-pay | Admitting: Podiatry

## 2020-04-14 NOTE — Progress Notes (Signed)
  Subjective:  Patient ID: Betty Rhodes, female    DOB: 1991/06/19,  MRN: [id number]  Chief Complaint  Patient presents with  . routine foot care    bilatral dry patches on the bottom of feet     29 y.o. female presents with the above complaint.  Patient presents with complaint of dry nonitching patches to bilateral lower extremity.  Patient states that these patches start off that are small circular but they get slightly bigger.  On the right side she also has a little bit of fissuring associated with it.  She has tried some over-the-counter moisturizers which has not helped.  She moisturizes multiple times throughout the day which has has not helped.  She states that now it started to hurt because of the fissures.  No itching associated with it there is mild burning associated with it.  Painful when displaced.  She denies any other acute complaints she has not seen any other specialist.  She denies any other treatment options   Review of Systems: Negative except as noted in the HPI. Denies N/V/F/Ch.  Past Medical History:  Diagnosis Date  . Frequent headaches     Current Outpatient Medications:  .  ALPRAZolam (XANAX) 0.25 MG tablet, Take 1 tablet by mouth every 8 (eight) hours as needed., Disp: , Rfl:  .  dicyclomine (BENTYL) 20 MG tablet, Take by mouth., Disp: , Rfl:  .  omeprazole (PRILOSEC) 20 MG capsule, Take by mouth., Disp: , Rfl:  .  ammonium lactate (AMLACTIN) 12 % lotion, Apply 1 application topically as needed for dry skin., Disp: 400 g, Rfl: 0 .  hydrOXYzine (ATARAX/VISTARIL) 10 MG tablet, Take 1 tablet (10 mg total) by mouth daily as needed., Disp: 20 tablet, Rfl: 0 .  levonorgestrel (MIRENA, 52 MG,) 20 MCG/24HR IUD, by Intrauterine route once. Inserted 09/2015, Disp: , Rfl:   Social History   Tobacco Use  Smoking Status Never Smoker  Smokeless Tobacco Never Used    No Known Allergies Objective:  There were no vitals filed for this visit. There is no height or weight  on file to calculate BMI. Constitutional Well developed. Well nourished.  Vascular Dorsalis pedis pulses palpable bilaterally. Posterior tibial pulses palpable bilaterally. Capillary refill normal to all digits.  No cyanosis or clubbing noted. Pedal hair growth normal.  Neurologic Normal speech. Oriented to person, place, and time. Epicritic sensation to light touch grossly present bilaterally.  Dermatologic  nonraised dark brown patch circular x3 to bilateral lower extremity.  No subjective findings of itching associated with it.  No signs of melanoma noted.  Fissuring noted on the right side.  Orthopedic: Normal joint ROM without Rhodes or crepitus bilaterally. No visible deformities. No bony tenderness.   Radiographs: None Assessment:   1. Xerosis cutis   2. Fissure in skin of foot    Plan:  Patient was evaluated and treated and all questions answered.  Bilateral severe xerosis with fissuring without itching -I explained the patient the etiology of xerosis and various treatment options were discussed.  I believe patient will benefit from ammonium lactate to be applied twice a day to help with the dryness.  Ultimately if there is no improvement in given that these patches are clearing and appearing sporadically, I believe patient will benefit from dermatological follow-up if there is no improvement.  Patient states understanding -Ammonium lactate was dispensed to be applied to twice daily  No follow-ups on file.

## 2020-05-13 ENCOUNTER — Ambulatory Visit: Payer: BC Managed Care – PPO | Admitting: Podiatry

## 2020-05-13 ENCOUNTER — Other Ambulatory Visit: Payer: Self-pay

## 2020-05-13 DIAGNOSIS — L853 Xerosis cutis: Secondary | ICD-10-CM | POA: Diagnosis not present

## 2020-05-13 DIAGNOSIS — L209 Atopic dermatitis, unspecified: Secondary | ICD-10-CM

## 2020-05-13 DIAGNOSIS — R234 Changes in skin texture: Secondary | ICD-10-CM | POA: Diagnosis not present

## 2020-05-17 ENCOUNTER — Encounter: Payer: Self-pay | Admitting: Podiatry

## 2020-05-17 NOTE — Progress Notes (Signed)
  Subjective:  Patient ID: Betty Rhodes, female    DOB: September 24, 1990,  MRN: [id number]  Chief Complaint  Patient presents with  . routine foot care    bilateral dry patches on the foot     29 y.o. female presents with the above complaint.  Patient presents with a follow-up of dry nonitching patches to both lower extremity.  Patient states the looks about the same.  She has been applying ammonium lactate which has not helped.  She has tried different other over-the-counter treatment options none of which has helped.  At this time she would like to get follow-up for dermatologist.  She states understanding   Review of Systems: Negative except as noted in the HPI. Denies N/V/F/Ch.  Past Medical History:  Diagnosis Date  . Frequent headaches     Current Outpatient Medications:  .  ALPRAZolam (XANAX) 0.25 MG tablet, Take 1 tablet by mouth every 8 (eight) hours as needed., Disp: , Rfl:  .  ammonium lactate (AMLACTIN) 12 % lotion, Apply 1 application topically as needed for dry skin., Disp: 400 g, Rfl: 0 .  dicyclomine (BENTYL) 20 MG tablet, Take by mouth., Disp: , Rfl:  .  hydrOXYzine (ATARAX/VISTARIL) 10 MG tablet, Take 1 tablet (10 mg total) by mouth daily as needed., Disp: 20 tablet, Rfl: 0 .  levonorgestrel (MIRENA, 52 MG,) 20 MCG/24HR IUD, by Intrauterine route once. Inserted 09/2015, Disp: , Rfl:  .  omeprazole (PRILOSEC) 20 MG capsule, Take by mouth., Disp: , Rfl:   Social History   Tobacco Use  Smoking Status Never Smoker  Smokeless Tobacco Never Used    No Known Allergies Objective:  There were no vitals filed for this visit. There is no height or weight on file to calculate BMI. Constitutional Well developed. Well nourished.  Vascular Dorsalis pedis pulses palpable bilaterally. Posterior tibial pulses palpable bilaterally. Capillary refill normal to all digits.  No cyanosis or clubbing noted. Pedal hair growth normal.  Neurologic Normal speech. Oriented to person, place,  and time. Epicritic sensation to light touch grossly present bilaterally.  Dermatologic  nonraised dark brown patch circular x3 to bilateral lower extremity.  No subjective findings of itching associated with it.  No signs of melanoma noted.  Fissuring noted on the right side.  Orthopedic: Normal joint ROM without Rhodes or crepitus bilaterally. No visible deformities. No bony tenderness.   Radiographs: None Assessment:   1. Xerosis cutis   2. Fissure in skin of foot   3. Atopic dermatitis and related condition    Plan:  Patient was evaluated and treated and all questions answered.  Bilateral severe xerosis with fissuring without itching/atopic dermatitis ~not improving -Clinically the patient's dermatitis is not improving.  At this time I believe patient will benefit from dermatological follow-up.  I believe patient may have subsequent underlying systemic concern given the nature of her dermatitis to the both lower extremity.  I discussed this with the patient in extensive detail. -She will be scheduled to follow with a dermatologist for further evaluation and management.  No follow-ups on file.

## 2020-05-31 ENCOUNTER — Telehealth: Payer: Self-pay | Admitting: Dermatology

## 2020-05-31 ENCOUNTER — Telehealth: Payer: Self-pay | Admitting: Podiatry

## 2020-05-31 NOTE — Telephone Encounter (Signed)
Let her know that unfortunately most of the dermatology offices are very booked out.  She can attempt to call some near dermatologist office and I am more than happy to send a referral over there when she is able to tell me which dermatologist she is able to get into the office with.

## 2020-05-31 NOTE — Telephone Encounter (Signed)
Patient requested the referral for dermatology  to be sent to another facility. Patient tried to get schedule and was told she wouldn't be seen until May 2022. Please Advise

## 2020-05-31 NOTE — Telephone Encounter (Signed)
Called patient, agreed to look for dermatologist and will let someone know before attempting to establish care

## 2020-05-31 NOTE — Telephone Encounter (Signed)
Referral from [facility], doesn't want to wait until May. Please contact them + see if they can get her in elsewhere sooner

## 2020-09-09 ENCOUNTER — Telehealth: Payer: Self-pay | Admitting: Podiatry

## 2020-09-09 NOTE — Telephone Encounter (Signed)
Pt called and stated that [facility] located in [place] is in network with her insurance. Could you please send Dermatologist Referral to that office.  Fax Number: [phone]

## 2020-09-09 NOTE — Telephone Encounter (Signed)
Patient has requested new referral for a dermatologist but will call back once she finds out more information regarding insurance plan (checking to see who is in network)

## 2020-09-10 ENCOUNTER — Other Ambulatory Visit: Payer: Self-pay | Admitting: Podiatry

## 2020-09-14 ENCOUNTER — Telehealth: Payer: Self-pay | Admitting: *Deleted

## 2020-09-14 NOTE — Telephone Encounter (Signed)
Called patient and she said that she has already made appointment w/ [facility] and they are  in network with her insurance company. Called [facility] and they said that they do not require referrals.

## 2020-09-15 NOTE — Telephone Encounter (Signed)
Wonderful thank you.

## 2021-01-03 IMAGING — US US BREAST*R* LIMITED INC AXILLA
1 series · 12 of 15 positions shown · non-contrast
Comparison: Previous exam(s).
COMPARISON: Previous exam(s).

Addendum:
CLINICAL DATA: Patient was found have a left breast mass 9445 which
was never followed up. The patient's physician feels a lump on the
right.

EXAM:
ULTRASOUND OF THE BILATERAL BREAST

[Series 1: us breast*right* limited inc axilla · 0.07mm/px · 12 of 15 slices shown]
[im 1/15]
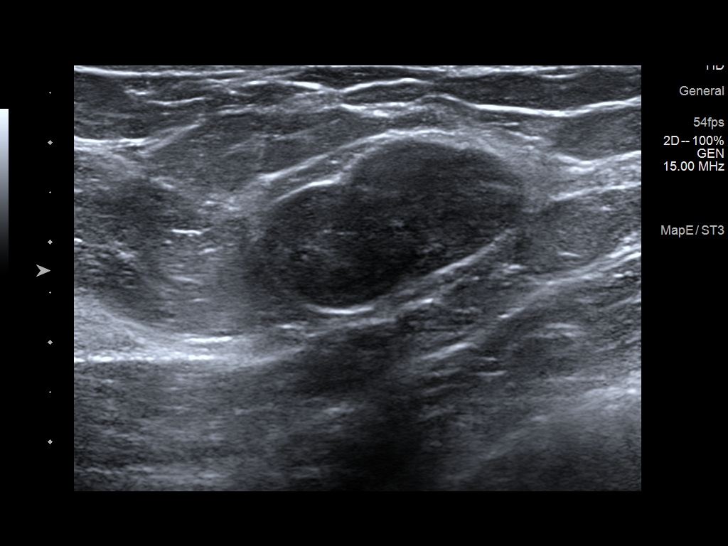
[im 2/15]
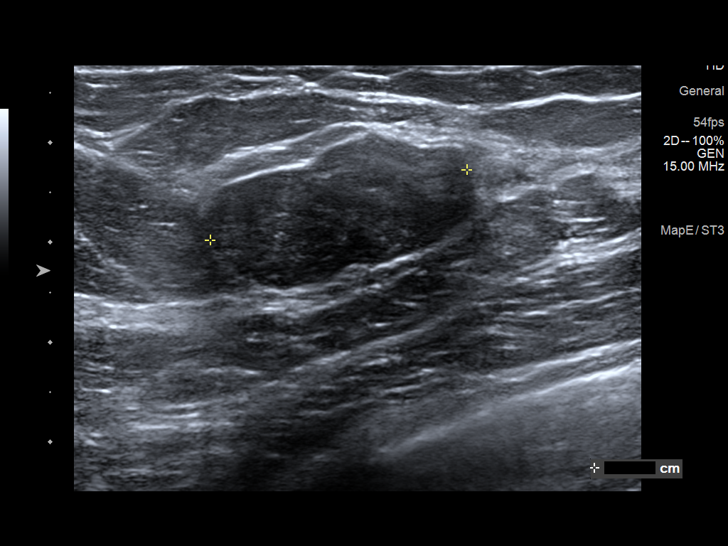
[im 4/15]
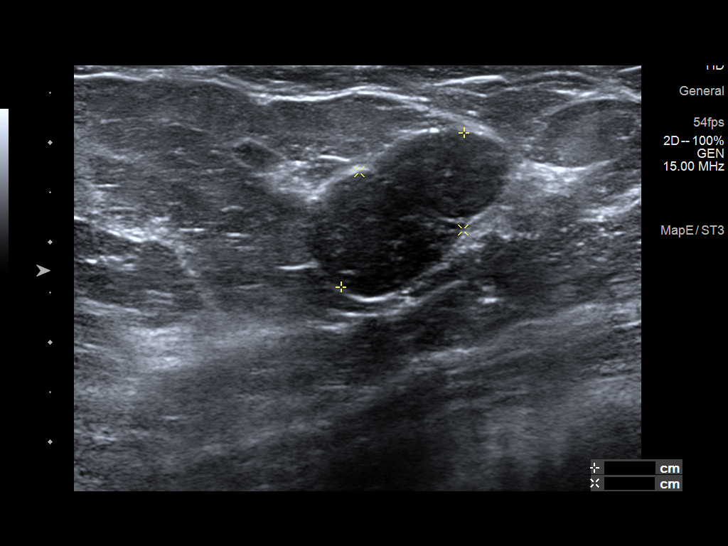
[im 5/15]
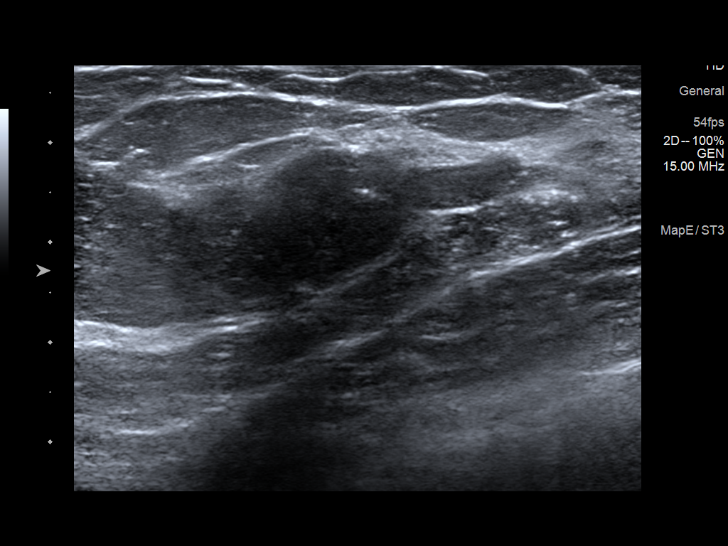
[im 6/15]
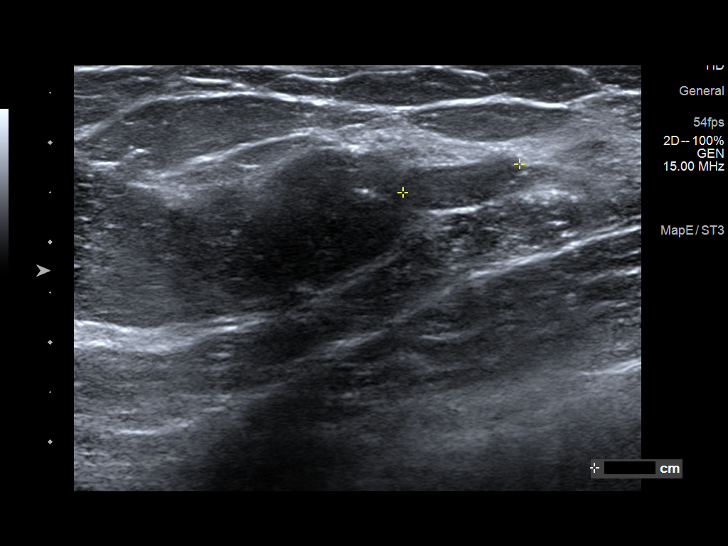
[im 7/15]
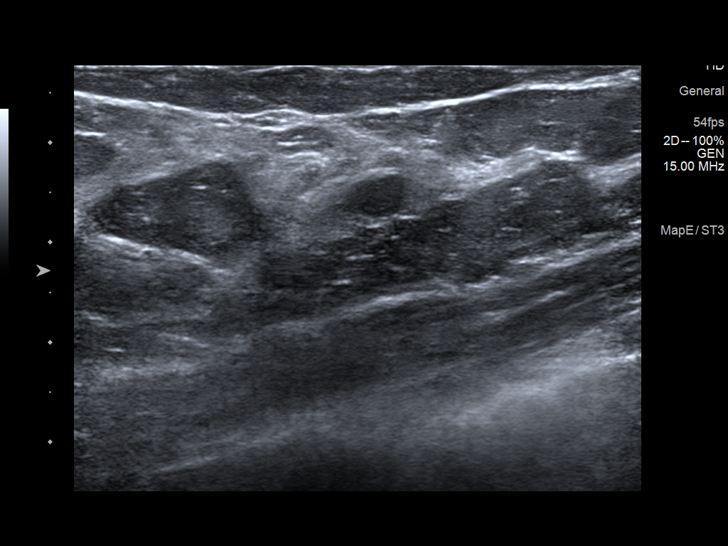
[im 9/15]
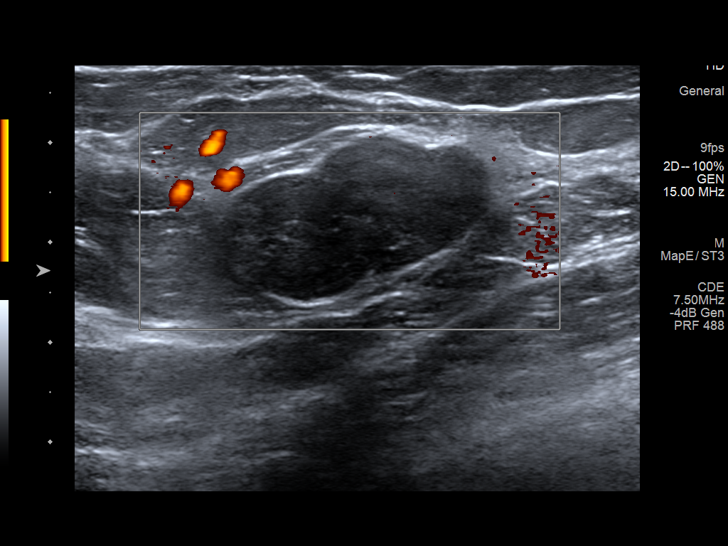
[im 10/15]
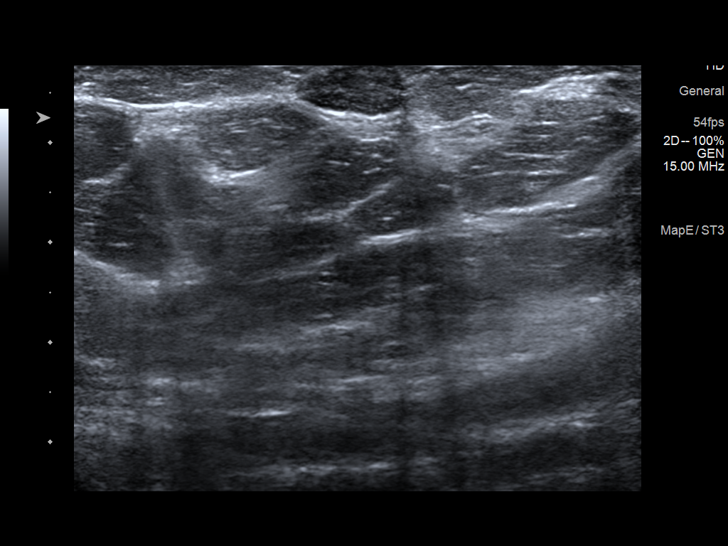
[im 11/15]
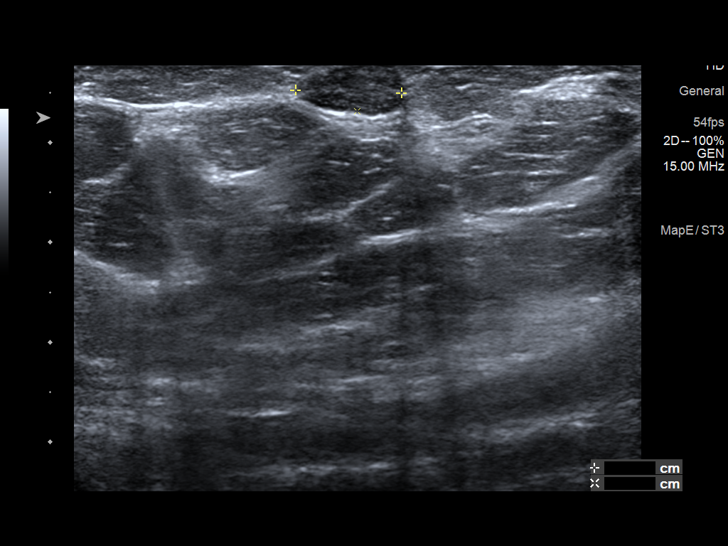
[im 12/15]
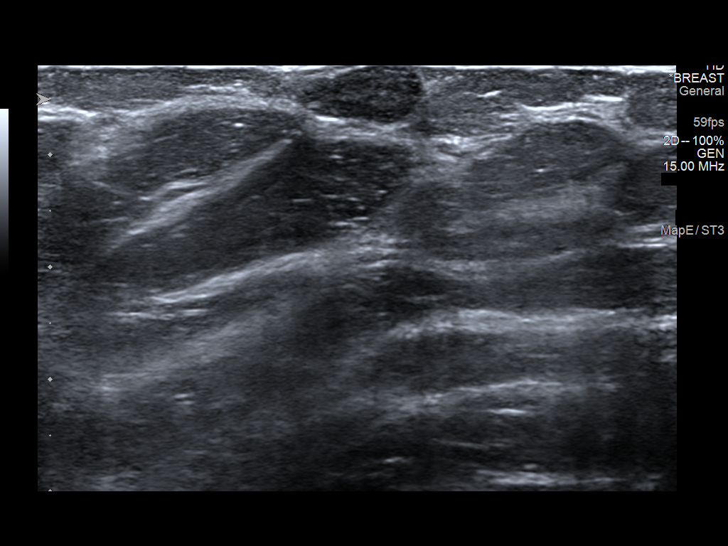
[im 14/15]
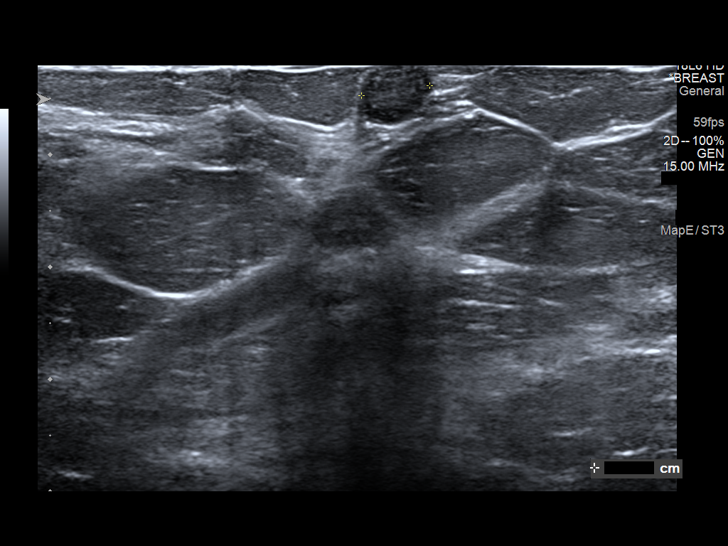
[im 15/15]
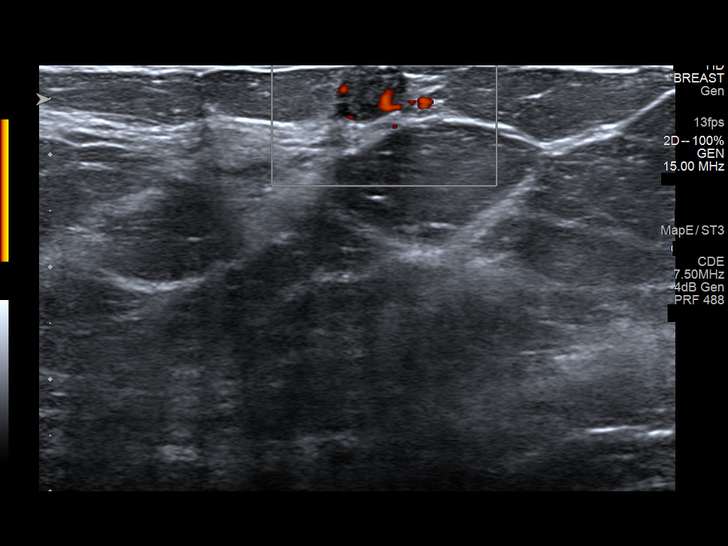

[12 of 15 positions shown; findings below may reference images not displayed]

FINDINGS: On physical exam, there is a palpable lump on the left at 4 o'clock.

Targeted ultrasound is performed, showing a persistent mass at 4
o'clock, 9 cm from the nipple on the left correlating with the
previous ultrasound. This mass measures 3.0 by 2.5 by 2.4 cm today
versus 2.0 x 1.5 by 1.9 cm in 9445. A second mass in the left breast
at 4 o'clock, 10 cm from the nipple measures 1.2 x 1.2 x 0.5 cm.

There is a mass in the right breast at 2 o'clock, 6 cm from the
nipple measuring 2.7 by 2.0 x 1.2 cm. An adjacent mass measures
by 0.5 x 0.4 cm. A final mass at 3 o'clock, 4 cm from the nipple
measures 1.1 x 0.5 x 0.6 cm.
IMPRESSION: Previously identified left breast mass at 4 o'clock, 9 cm from the
nipple has grown by greater than 20%. Three masses are seen on the
right and a second mass is seen on the left. These additional masses
are probably benign.

RECOMMENDATION:
Recommend ultrasound-guided biopsy of the left breast mass at 4
o'clock, 9 cm from the nipple. If this biopsy is benign, recommend
six-month follow-up of the remaining bilateral breast masses which
are probably benign.

I have discussed the findings and recommendations with the patient.
If applicable, a reminder letter will be sent to the patient
regarding the next appointment.

BI-RADS CATEGORY  3: Probably benign.

ADDENDUM:
BI-RADS category 4-suspicious

*** End of Addendum ***
FINDINGS: On physical exam, there is a palpable lump on the left at 4 o'clock.

Targeted ultrasound is performed, showing a persistent mass at 4
o'clock, 9 cm from the nipple on the left correlating with the
previous ultrasound. This mass measures 3.0 by 2.5 by 2.4 cm today
versus 2.0 x 1.5 by 1.9 cm in 9445. A second mass in the left breast
at 4 o'clock, 10 cm from the nipple measures 1.2 x 1.2 x 0.5 cm.

There is a mass in the right breast at 2 o'clock, 6 cm from the
nipple measuring 2.7 by 2.0 x 1.2 cm. An adjacent mass measures
by 0.5 x 0.4 cm. A final mass at 3 o'clock, 4 cm from the nipple
measures 1.1 x 0.5 x 0.6 cm.
IMPRESSION: Previously identified left breast mass at 4 o'clock, 9 cm from the
nipple has grown by greater than 20%. Three masses are seen on the
right and a second mass is seen on the left. These additional masses
are probably benign.

RECOMMENDATION:
Recommend ultrasound-guided biopsy of the left breast mass at 4
o'clock, 9 cm from the nipple. If this biopsy is benign, recommend
six-month follow-up of the remaining bilateral breast masses which
are probably benign.

I have discussed the findings and recommendations with the patient.
If applicable, a reminder letter will be sent to the patient
regarding the next appointment.

BI-RADS CATEGORY  3: Probably benign.

## 2021-01-25 IMAGING — US US BREAST BX W LOC DEV 1ST LESION IMG BX SPEC US GUIDE*L*
1 series · 7 of 7 positions shown · non-contrast
Comparison: Previous exam(s).
COMPARISON: Previous exam(s).

Addendum:
CLINICAL DATA: Patient with indeterminate left breast mass 4
o'clock position.

EXAM:
ULTRASOUND GUIDED LEFT BREAST CORE NEEDLE BIOPSY

[Series 1: us breast bx w loc dev 1st lesion img bx spec us g · 0.07mm/px · 7 of 7 slices shown]
[im 1/7]
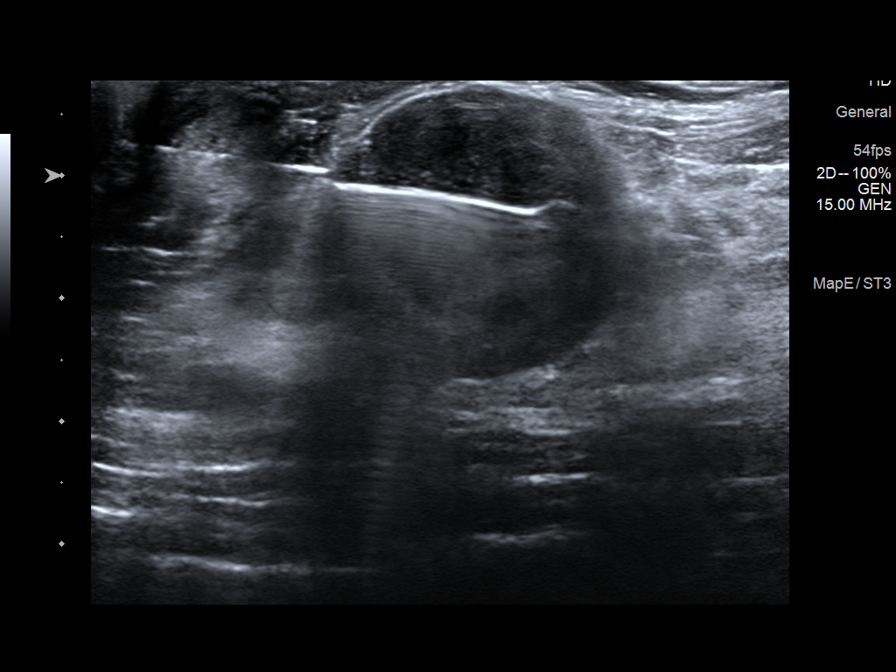
[im 2/7]
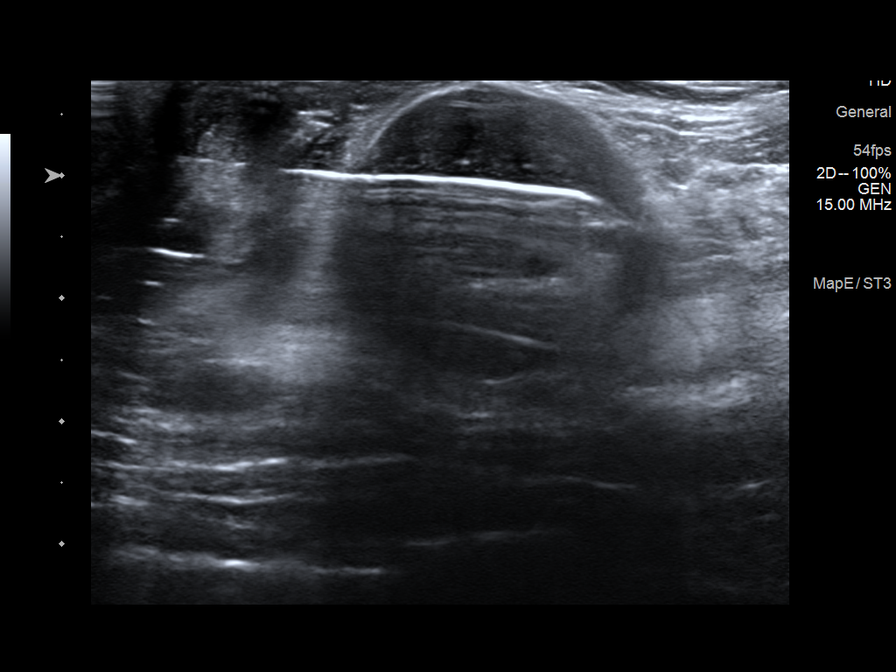
[im 3/7]
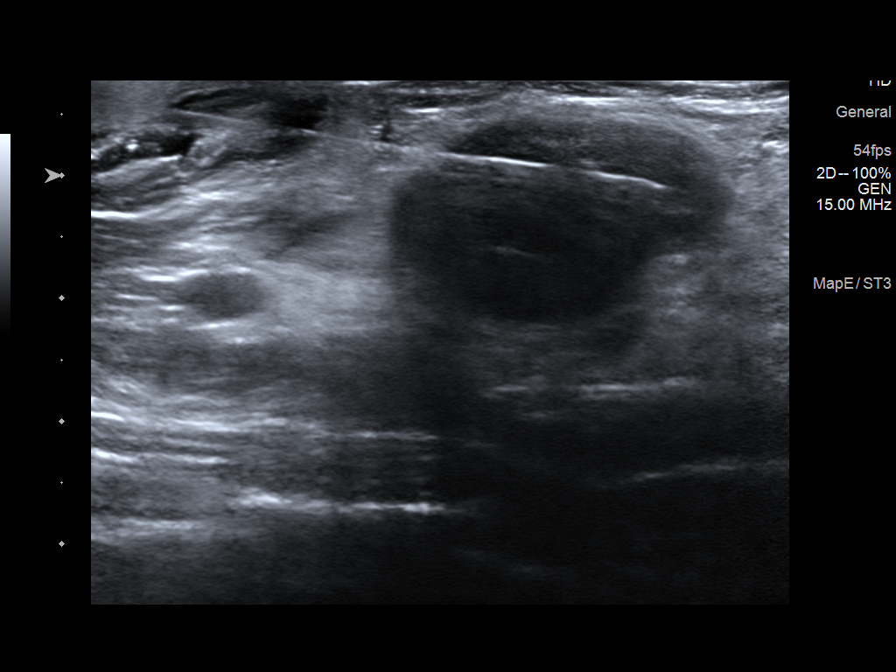
[im 4/7]
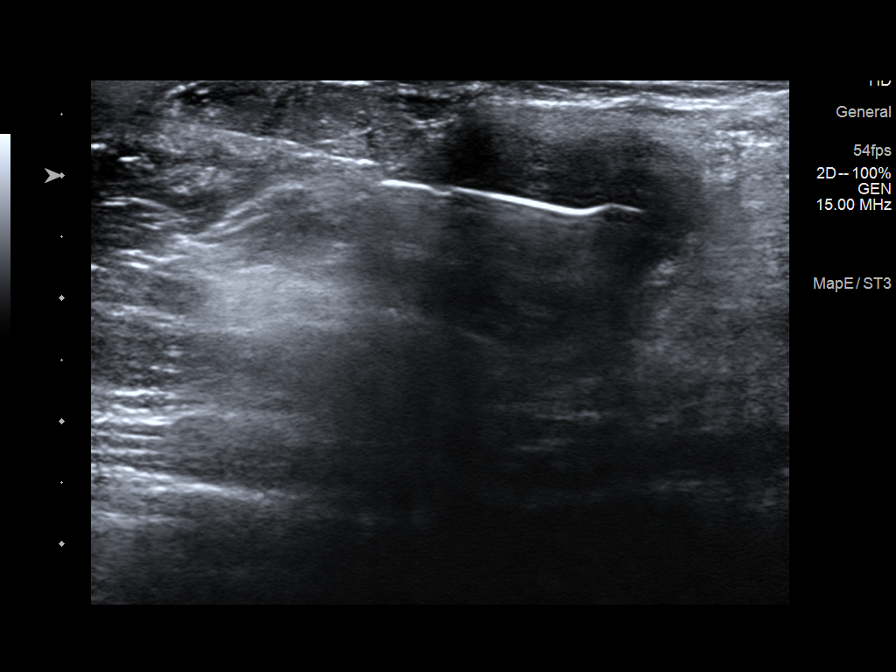
[im 5/7]
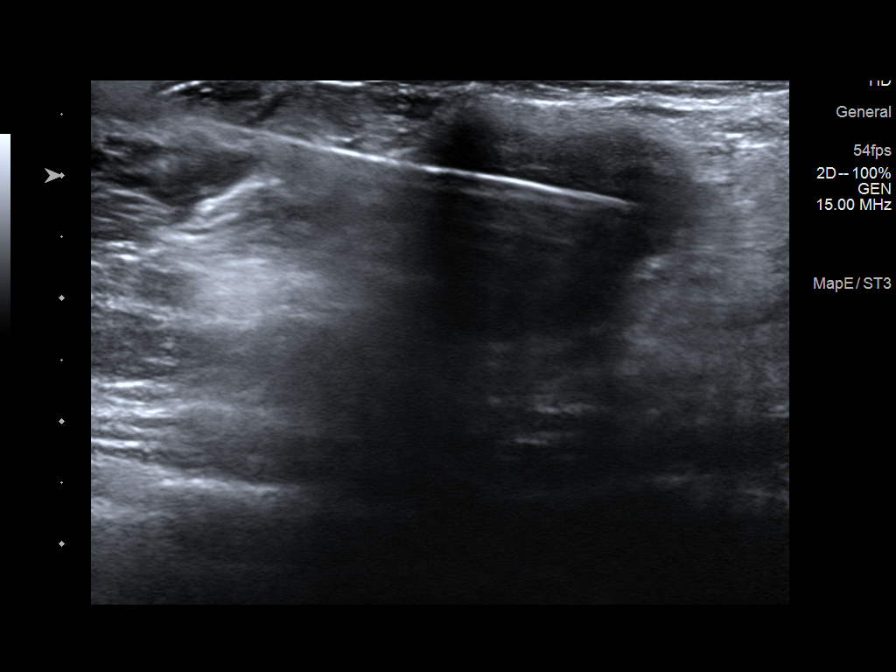
[im 6/7]
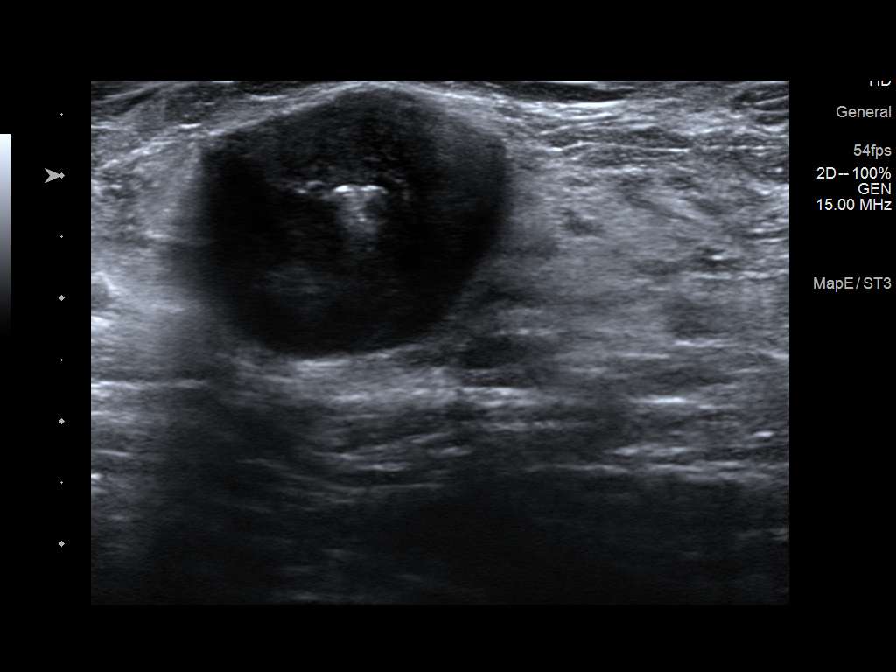
[im 7/7]
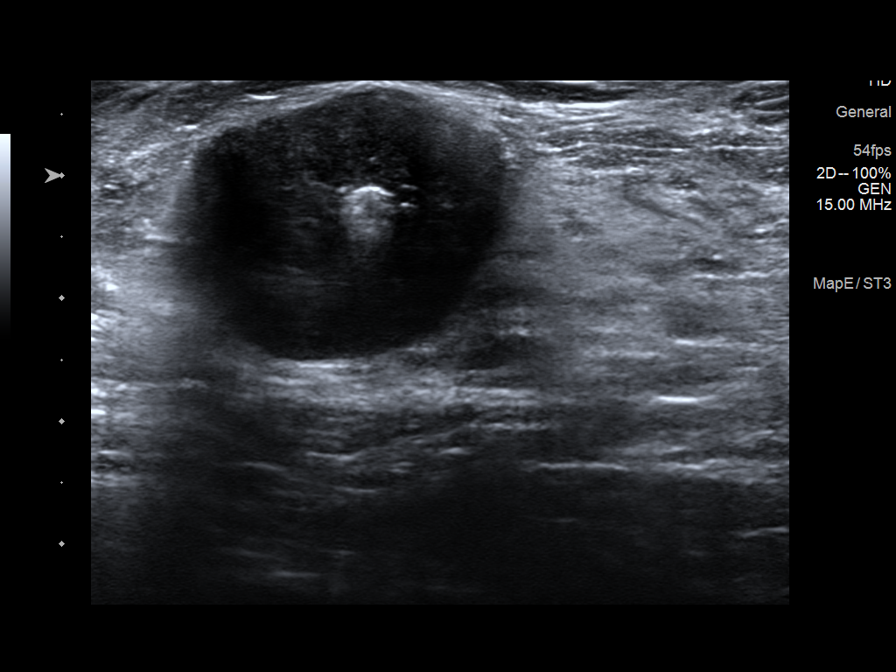

[7 of 7 positions shown; findings below may reference images not displayed]



Lesion quadrant: Lower outer quadrant

Using sterile technique and 1% Lidocaine as local anesthetic, under
direct ultrasound visualization, a 14 gauge Billiot device was
used to perform biopsy of left breast mass 4 o'clock position using
a lateral approach. At the conclusion of the procedure Q shaped
tissue marker clip was deployed into the biopsy cavity. Follow up 2
view mammogram was performed and dictated separately.
IMPRESSION: Ultrasound guided biopsy of left breast mass 4 o'clock position. No
apparent complications.

ADDENDUM:
Pathology revealed FIBROADENOMA of the LEFT breast, 4 o'clock. This
was found to be concordant by Dr. Pierre Claman Auriol, with excision
recommended for interval increase in size. Patient desires to have
mass removed due to its enlargement.

Pathology results were discussed with the patient by telephone. The
patient reported doing well after the biopsy with tenderness at the
site. Post biopsy instructions and care were reviewed and questions
were answered. The patient was encouraged to call The [REDACTED]

Per patient request, surgical consultation has been arranged with

The patient was instructed to return for bilateral diagnostic
ultrasound in 6 months and informed a reminder notice would be sent
regarding this appointment.

Pathology results reported by Emile Morel RN on 10/08/2019.



Lesion quadrant: Lower outer quadrant

Using sterile technique and 1% Lidocaine as local anesthetic, under
direct ultrasound visualization, a 14 gauge Billiot device was
used to perform biopsy of left breast mass 4 o'clock position using
a lateral approach. At the conclusion of the procedure Q shaped
tissue marker clip was deployed into the biopsy cavity. Follow up 2
view mammogram was performed and dictated separately.
IMPRESSION: Ultrasound guided biopsy of left breast mass 4 o'clock position. No
apparent complications.

## 2021-08-11 ENCOUNTER — Ambulatory Visit: Payer: Self-pay

## 2021-08-15 ENCOUNTER — Other Ambulatory Visit: Payer: Self-pay

## 2021-08-15 ENCOUNTER — Ambulatory Visit
Admission: RE | Admit: 2021-08-15 | Discharge: 2021-08-15 | Disposition: A | Discharge status: Home / Self Care | Payer: BC Managed Care – PPO | Source: Ambulatory Visit | Attending: Emergency Medicine | Admitting: Emergency Medicine

## 2021-08-15 VITALS — BP 111/73 | HR 84 | Temp 98.4°F | Resp 18

## 2021-08-15 DIAGNOSIS — B349 Viral infection, unspecified: Secondary | ICD-10-CM | POA: Diagnosis not present

## 2021-08-15 LAB — POCT RAPID STREP A (OFFICE): Rapid Strep A Screen: NEGATIVE

## 2021-08-15 NOTE — Discharge Instructions (Addendum)
Your strep test is negative.  Your COVID, RSV, and Flu tests are pending.  You should self quarantine until the test results are back.   ° °Take Tylenol or ibuprofen as needed for fever or discomfort.  Rest and keep yourself hydrated.   ° °Follow-up with your primary care provider if your symptoms are not improving.   ° ° °

## 2021-08-15 NOTE — ED Provider Notes (Signed)
Renaldo Fiddler    CSN: [id number] Arrival date & time: 08/15/21  1237      History   Chief Complaint Chief Complaint  Patient presents with   Sore Throat   Generalized Body Aches    HPI ADITHI GAMMON is a 31 y.o. female.  Patient presents with 5-day history of body aches, fatigue, and sore throat.  1 episode of emesis yesterday; none today.  She denies fever, rash, cough, shortness of breath, diarrhea, or other symptoms.  No treatments attempted at home.  Her medical history includes frequent headaches and generalized anxiety disorder.  The history is provided by the patient.   Past Medical History:  Diagnosis Date   Frequent headaches     Patient Active Problem List   Diagnosis Date Noted   GAD (generalized anxiety disorder) 08/27/2016    History reviewed. No pertinent surgical history.  OB History   No obstetric history on file.      Home Medications    Prior to Admission medications   Medication Sig Start Date End Date Taking? Authorizing Provider  ALPRAZolam (XANAX) 0.25 MG tablet Take 1 tablet by mouth every 8 (eight) hours as needed. 05/27/18   [provider]  ammonium lactate (AMLACTIN) 12 % lotion Apply 1 application topically as needed for dry skin. 04/13/20   Candelaria Stagers, DPM  dicyclomine (BENTYL) 20 MG tablet Take by mouth. 03/14/18   [provider]  hydrOXYzine (ATARAX/VISTARIL) 10 MG tablet Take 1 tablet (10 mg total) by mouth daily as needed. 08/27/16   Lorre Munroe, NP  levonorgestrel (MIRENA, 52 MG,) 20 MCG/24HR IUD by Intrauterine route once. Inserted 09/2015    [provider]  omeprazole (PRILOSEC) 20 MG capsule Take by mouth. 03/14/18   [provider]    Family History Family History  Problem Relation Age of Onset   Depression Mother    Hyperlipidemia Father    Hypertension Father    Depression Father    Diabetes Father    Diabetes Paternal Aunt    Hyperlipidemia Paternal Aunt     Hypertension Paternal Aunt    Diabetes Paternal Uncle    Hyperlipidemia Paternal Uncle    Hypertension Paternal Uncle     Social History Social History   Tobacco Use   Smoking status: Never   Smokeless tobacco: Never  Substance Use Topics   Alcohol use: Yes    Comment: social - liquor   Drug use: Yes    Types: Marijuana    Comment: every other week     Allergies   Patient has no known allergies.   Review of Systems Review of Systems  Constitutional:  Positive for fatigue. Negative for chills and fever.  HENT:  Positive for sore throat. Negative for ear pain.   Respiratory:  Negative for cough and shortness of breath.   Cardiovascular:  Negative for chest pain and palpitations.  Gastrointestinal:  Negative for diarrhea and vomiting.  Skin:  Negative for color change and rash.  Neurological:  Negative for syncope.  All other systems reviewed and are negative.   Physical Exam Triage Vital Signs ED Triage Vitals  Enc Vitals Group     BP      Pulse      Resp      Temp      Temp src      SpO2      Weight      Height      Head Circumference  Peak Flow      Pain Score      Pain Loc      Pain Edu?      Excl. in GC?    No data found.  Updated Vital Signs BP 111/73    Pulse 84    Temp 98.4 F (36.9 C)    Resp 18    SpO2 96%   Visual Acuity Right Eye Distance:   Left Eye Distance:   Bilateral Distance:    Right Eye Near:   Left Eye Near:    Bilateral Near:     Physical Exam Vitals and nursing note reviewed.  Constitutional:      General: She is not in acute distress.    Appearance: She is well-developed.  HENT:     Right Ear: Tympanic membrane normal.     Left Ear: Tympanic membrane normal.     Nose: Nose normal.     Mouth/Throat:     Mouth: Mucous membranes are moist.     Pharynx: Oropharynx is clear.  Cardiovascular:     Rate and Rhythm: Normal rate and regular rhythm.     Heart sounds: Normal heart sounds.  Pulmonary:     Effort:  Pulmonary effort is normal. No respiratory distress.     Breath sounds: Normal breath sounds.  Abdominal:     Palpations: Abdomen is soft.     Tenderness: There is no abdominal tenderness.  Musculoskeletal:     Cervical back: Neck supple.  Skin:    General: Skin is warm and dry.  Neurological:     Mental Status: She is alert.  Psychiatric:        Mood and Affect: Mood normal.        Behavior: Behavior normal.     UC Treatments / Results  Labs (all labs ordered are listed, but only abnormal results are displayed) Labs Reviewed  POCT RAPID STREP A (OFFICE) - Normal  COVID-19, FLU A+B AND RSV    EKG   Radiology No results found.  Procedures Procedures (including critical care time)  Medications Ordered in UC Medications - No data to display  Initial Impression / Assessment and Plan / UC Course  I have reviewed the triage vital signs and the nursing notes.  Pertinent labs & imaging results that were available during my care of the patient were reviewed by me and considered in my medical decision making (see chart for details).   Viral illness.  Rapid strep negative.  COVID, Flu, and RSV pending.  Instructed patient to self quarantine per CDC guidelines.  Discussed symptomatic treatment including Tylenol or ibuprofen, rest, hydration.  Instructed patient to follow up with PCP if symptoms are not improving.  Patient agrees to plan of care.    Final Clinical Impressions(s) / UC Diagnoses   Final diagnoses:  Viral illness     Discharge Instructions      Your strep test is negative.  Your COVID, RSV, and Flu tests are pending.  You should self quarantine until the test results are back.    Take Tylenol or ibuprofen as needed for fever or discomfort.  Rest and keep yourself hydrated.    Follow-up with your primary care provider if your symptoms are not improving.         ED Prescriptions   None    PDMP not reviewed this encounter.   Mickie Bail,  NP 08/15/21 1332

## 2021-08-15 NOTE — ED Triage Notes (Signed)
Pt here with sore throat and body aches x 5 days.

## 2021-08-17 LAB — COVID-19, FLU A+B AND RSV
Influenza A, NAA: NOT DETECTED
Influenza B, NAA: NOT DETECTED
RSV, NAA: NOT DETECTED
SARS-CoV-2, NAA: NOT DETECTED
